# Patient Record
Sex: Female | Born: 1945
Health system: Southern US, Community
[De-identification: ages and names within clinical notes are randomized; demographics above are authoritative.]

## PROBLEM LIST (undated history)

## (undated) DIAGNOSIS — H269 Unspecified cataract: Secondary | ICD-10-CM

## (undated) DIAGNOSIS — E079 Disorder of thyroid, unspecified: Secondary | ICD-10-CM

## (undated) DIAGNOSIS — E785 Hyperlipidemia, unspecified: Secondary | ICD-10-CM

## (undated) DIAGNOSIS — T7840XA Allergy, unspecified, initial encounter: Secondary | ICD-10-CM

## (undated) DIAGNOSIS — I10 Essential (primary) hypertension: Secondary | ICD-10-CM

## (undated) DIAGNOSIS — D649 Anemia, unspecified: Secondary | ICD-10-CM

## (undated) DIAGNOSIS — G4730 Sleep apnea, unspecified: Secondary | ICD-10-CM

## (undated) DIAGNOSIS — R740 Nonspecific elevation of levels of transaminase and lactic acid dehydrogenase [LDH]: Secondary | ICD-10-CM

## (undated) HISTORY — DX: Essential (primary) hypertension: I10

## (undated) HISTORY — PX: EYE SURGERY: SHX253

## (undated) HISTORY — DX: Nonspecific elevation of levels of transaminase and lactic acid dehydrogenase (ldh): R74.0

## (undated) HISTORY — DX: Anemia, unspecified: D64.9

## (undated) HISTORY — DX: Disorder of thyroid, unspecified: E07.9

## (undated) HISTORY — PX: POLYPECTOMY: SHX149

## (undated) HISTORY — PX: ABDOMINAL HYSTERECTOMY: SHX81

## (undated) HISTORY — DX: Sleep apnea, unspecified: G47.30

## (undated) HISTORY — DX: Unspecified cataract: H26.9

## (undated) HISTORY — DX: Allergy, unspecified, initial encounter: T78.40XA

## (undated) HISTORY — DX: Hyperlipidemia, unspecified: E78.5

## (undated) HISTORY — PX: OTHER SURGICAL HISTORY: SHX169

## (undated) HISTORY — PX: SIGMOIDOSCOPY: SUR1295

---

## 1999-06-17 HISTORY — PX: FLEXIBLE SIGMOIDOSCOPY: SHX1649

## 2000-01-30 ENCOUNTER — Other Ambulatory Visit: Admission: RE | Admit: 2000-01-30 | Discharge: 2000-01-30 | Payer: Self-pay | Admitting: Internal Medicine

## 2003-07-06 ENCOUNTER — Encounter: Admission: RE | Admit: 2003-07-06 | Discharge: 2003-07-06 | Payer: Self-pay | Admitting: Internal Medicine

## 2003-10-18 ENCOUNTER — Ambulatory Visit (HOSPITAL_BASED_OUTPATIENT_CLINIC_OR_DEPARTMENT_OTHER): Admission: RE | Admit: 2003-10-18 | Discharge: 2003-10-18 | Payer: Self-pay | Admitting: Internal Medicine

## 2004-10-08 ENCOUNTER — Ambulatory Visit: Payer: Self-pay | Admitting: Internal Medicine

## 2004-10-22 ENCOUNTER — Ambulatory Visit: Payer: Self-pay | Admitting: Internal Medicine

## 2006-10-13 ENCOUNTER — Encounter: Admission: RE | Admit: 2006-10-13 | Discharge: 2006-10-13 | Payer: Self-pay | Admitting: Internal Medicine

## 2007-01-25 DIAGNOSIS — E785 Hyperlipidemia, unspecified: Secondary | ICD-10-CM | POA: Insufficient documentation

## 2008-07-04 ENCOUNTER — Ambulatory Visit: Payer: Self-pay | Admitting: Internal Medicine

## 2008-07-04 LAB — CONVERTED CEMR LAB
ALT: 69 units/L — ABNORMAL HIGH (ref 0–35)
AST: 53 units/L — ABNORMAL HIGH (ref 0–37)
Albumin: 4.3 g/dL (ref 3.5–5.2)
Alkaline Phosphatase: 62 units/L (ref 39–117)
BUN: 14 mg/dL (ref 6–23)
Basophils Absolute: 0 10*3/uL (ref 0.0–0.1)
Basophils Relative: 0.4 % (ref 0.0–3.0)
Bilirubin Urine: NEGATIVE
Bilirubin, Direct: 0.1 mg/dL (ref 0.0–0.3)
Blood in Urine, dipstick: NEGATIVE
CO2: 31 meq/L (ref 19–32)
Calcium: 9.6 mg/dL (ref 8.4–10.5)
Chloride: 104 meq/L (ref 96–112)
Cholesterol: 216 mg/dL (ref 0–200)
Creatinine, Ser: 0.9 mg/dL (ref 0.4–1.2)
Direct LDL: 148.4 mg/dL
Eosinophils Absolute: 0.1 10*3/uL (ref 0.0–0.7)
Eosinophils Relative: 1.9 % (ref 0.0–5.0)
GFR calc Af Amer: 82 mL/min
GFR calc non Af Amer: 67 mL/min
Glucose, Bld: 111 mg/dL — ABNORMAL HIGH (ref 70–99)
Glucose, Urine, Semiquant: NEGATIVE
HCT: 39.5 % (ref 36.0–46.0)
HDL: 40.7 mg/dL (ref >39.0)
Hemoglobin: 13.8 g/dL (ref 12.0–15.0)
Lymphocytes Relative: 33.2 % (ref 12.0–46.0)
MCHC: 35 g/dL (ref 30.0–36.0)
MCV: 91.9 fL (ref 78.0–100.0)
Monocytes Absolute: 0.6 10*3/uL (ref 0.1–1.0)
Monocytes Relative: 10.8 % (ref 3.0–12.0)
Neutro Abs: 3.2 10*3/uL (ref 1.4–7.7)
Neutrophils Relative %: 53.7 % (ref 43.0–77.0)
Nitrite: NEGATIVE
Platelets: 238 10*3/uL (ref 150–400)
Potassium: 4 meq/L (ref 3.5–5.1)
RBC: 4.3 M/uL (ref 3.87–5.11)
RDW: 12.9 % (ref 11.5–14.6)
Sodium: 143 meq/L (ref 135–145)
Specific Gravity, Urine: 1.02
TSH: 1.02 microintl units/mL (ref 0.35–5.50)
Total Bilirubin: 1 mg/dL (ref 0.3–1.2)
Total CHOL/HDL Ratio: 5.3
Total Protein: 7.9 g/dL (ref 6.0–8.3)
Triglycerides: 136 mg/dL (ref 0–149)
Urobilinogen, UA: 0.2
VLDL: 27 mg/dL (ref 0–40)
WBC: 5.9 10*3/uL (ref 4.5–10.5)
pH: 6

## 2008-07-11 ENCOUNTER — Ambulatory Visit: Payer: Self-pay | Admitting: Internal Medicine

## 2008-07-11 DIAGNOSIS — G4733 Obstructive sleep apnea (adult) (pediatric): Secondary | ICD-10-CM | POA: Insufficient documentation

## 2008-07-11 DIAGNOSIS — Z9989 Dependence on other enabling machines and devices: Secondary | ICD-10-CM

## 2008-10-11 ENCOUNTER — Ambulatory Visit: Payer: Self-pay | Admitting: Internal Medicine

## 2008-10-11 LAB — CONVERTED CEMR LAB
ALT: 90 units/L — ABNORMAL HIGH (ref 0–35)
AST: 70 units/L — ABNORMAL HIGH (ref 0–37)
Albumin: 4 g/dL (ref 3.5–5.2)
Alkaline Phosphatase: 66 units/L (ref 39–117)
Bilirubin, Direct: 0 mg/dL (ref 0.0–0.3)
Cholesterol: 225 mg/dL — ABNORMAL HIGH (ref 0–200)
Direct LDL: 148.8 mg/dL
HDL: 37.9 mg/dL — ABNORMAL LOW (ref >39.00)
Hgb A1c MFr Bld: 6.1 % (ref 4.6–6.5)
Total Bilirubin: 0.9 mg/dL (ref 0.3–1.2)
Total CHOL/HDL Ratio: 6
Total Protein: 7.8 g/dL (ref 6.0–8.3)
Triglycerides: 158 mg/dL — ABNORMAL HIGH (ref 0.0–149.0)
VLDL: 31.6 mg/dL (ref 0.0–40.0)

## 2008-10-19 ENCOUNTER — Ambulatory Visit: Payer: Self-pay | Admitting: Internal Medicine

## 2008-10-19 DIAGNOSIS — R7401 Elevation of levels of liver transaminase levels: Secondary | ICD-10-CM

## 2008-10-19 DIAGNOSIS — I10 Essential (primary) hypertension: Secondary | ICD-10-CM | POA: Insufficient documentation

## 2008-10-19 DIAGNOSIS — R740 Nonspecific elevation of levels of transaminase and lactic acid dehydrogenase [LDH]: Secondary | ICD-10-CM

## 2008-10-19 DIAGNOSIS — R7402 Elevation of levels of lactic acid dehydrogenase (LDH): Secondary | ICD-10-CM

## 2008-10-19 HISTORY — DX: Elevation of levels of liver transaminase levels: R74.01

## 2008-10-19 HISTORY — DX: Elevation of levels of lactic acid dehydrogenase (LDH): R74.02

## 2008-10-19 LAB — CONVERTED CEMR LAB
Ceruloplasmin: 41 mg/dL (ref 21–63)
HCV Ab: NEGATIVE
Hep A IgM: NEGATIVE
Hep B C IgM: NEGATIVE
Hepatitis B Surface Ag: NEGATIVE
Iron: 74 ug/dL (ref 42–145)
LDL Goal: 130 mg/dL
Saturation Ratios: 19.3 % — ABNORMAL LOW (ref 20.0–50.0)
Transferrin: 274 mg/dL (ref 212.0–360.0)

## 2008-10-23 ENCOUNTER — Encounter: Admission: RE | Admit: 2008-10-23 | Discharge: 2008-10-23 | Payer: Self-pay | Admitting: Internal Medicine

## 2009-01-18 ENCOUNTER — Ambulatory Visit: Payer: Self-pay | Admitting: Internal Medicine

## 2009-01-22 LAB — CONVERTED CEMR LAB
ALT: 64 units/L — ABNORMAL HIGH (ref 0–35)
AST: 52 units/L — ABNORMAL HIGH (ref 0–37)
Albumin: 3.9 g/dL (ref 3.5–5.2)
Alkaline Phosphatase: 59 units/L (ref 39–117)
Bilirubin, Direct: 0.1 mg/dL (ref 0.0–0.3)
Total Bilirubin: 0.9 mg/dL (ref 0.3–1.2)
Total Protein: 7.4 g/dL (ref 6.0–8.3)

## 2009-02-02 ENCOUNTER — Ambulatory Visit: Payer: Self-pay | Admitting: Internal Medicine

## 2009-05-24 ENCOUNTER — Ambulatory Visit: Payer: Self-pay | Admitting: Internal Medicine

## 2009-05-24 LAB — CONVERTED CEMR LAB
ALT: 72 units/L — ABNORMAL HIGH (ref 0–35)
AST: 60 units/L — ABNORMAL HIGH (ref 0–37)
Albumin: 3.9 g/dL (ref 3.5–5.2)
Alkaline Phosphatase: 58 units/L (ref 39–117)
Bilirubin, Direct: 0 mg/dL (ref 0.0–0.3)
Total Bilirubin: 0.8 mg/dL (ref 0.3–1.2)
Total Protein: 7.5 g/dL (ref 6.0–8.3)

## 2009-09-13 ENCOUNTER — Ambulatory Visit: Payer: Self-pay | Admitting: Internal Medicine

## 2009-09-13 LAB — CONVERTED CEMR LAB
ALT: 81 units/L — ABNORMAL HIGH (ref 0–35)
AST: 76 units/L — ABNORMAL HIGH (ref 0–37)
Albumin: 4 g/dL (ref 3.5–5.2)
Alkaline Phosphatase: 57 units/L (ref 39–117)
Bilirubin, Direct: 0.1 mg/dL (ref 0.0–0.3)
Cholesterol: 225 mg/dL — ABNORMAL HIGH (ref 0–200)
Direct LDL: 156.4 mg/dL
HDL: 39.9 mg/dL (ref >39.00)
Total Bilirubin: 0.6 mg/dL (ref 0.3–1.2)
Total Protein: 7.5 g/dL (ref 6.0–8.3)
Triglycerides: 240 mg/dL — ABNORMAL HIGH (ref 0.0–149.0)

## 2010-01-01 ENCOUNTER — Ambulatory Visit: Payer: Self-pay | Admitting: Family Medicine

## 2010-01-01 ENCOUNTER — Telehealth: Payer: Self-pay | Admitting: Internal Medicine

## 2010-01-01 DIAGNOSIS — S139XXA Sprain of joints and ligaments of unspecified parts of neck, initial encounter: Secondary | ICD-10-CM | POA: Insufficient documentation

## 2010-01-04 ENCOUNTER — Ambulatory Visit: Payer: Self-pay | Admitting: Internal Medicine

## 2010-01-04 LAB — CONVERTED CEMR LAB
ALT: 68 units/L — ABNORMAL HIGH (ref 0–35)
AST: 53 units/L — ABNORMAL HIGH (ref 0–37)
Albumin: 4.2 g/dL (ref 3.5–5.2)
Alkaline Phosphatase: 52 units/L (ref 39–117)
BUN: 12 mg/dL (ref 6–23)
Bilirubin, Direct: 0.2 mg/dL (ref 0.0–0.3)
CO2: 27 meq/L (ref 19–32)
Calcium: 9.7 mg/dL (ref 8.4–10.5)
Chloride: 104 meq/L (ref 96–112)
Cholesterol: 234 mg/dL — ABNORMAL HIGH (ref 0–200)
Creatinine, Ser: 0.8 mg/dL (ref 0.4–1.2)
Direct LDL: 159.1 mg/dL
GFR calc non Af Amer: 76.78 mL/min (ref >60)
Glucose, Bld: 110 mg/dL — ABNORMAL HIGH (ref 70–99)
HDL: 38.5 mg/dL — ABNORMAL LOW (ref >39.00)
Potassium: 4.5 meq/L (ref 3.5–5.1)
Sodium: 142 meq/L (ref 135–145)
Total Bilirubin: 0.7 mg/dL (ref 0.3–1.2)
Total CHOL/HDL Ratio: 6
Total Protein: 7.6 g/dL (ref 6.0–8.3)
Triglycerides: 178 mg/dL — ABNORMAL HIGH (ref 0.0–149.0)
VLDL: 35.6 mg/dL (ref 0.0–40.0)

## 2010-02-20 ENCOUNTER — Telehealth: Payer: Self-pay | Admitting: Internal Medicine

## 2010-04-19 ENCOUNTER — Ambulatory Visit: Payer: Self-pay | Admitting: Internal Medicine

## 2010-04-19 DIAGNOSIS — E669 Obesity, unspecified: Secondary | ICD-10-CM | POA: Insufficient documentation

## 2010-05-11 LAB — HM SIGMOIDOSCOPY

## 2010-05-17 ENCOUNTER — Encounter: Admission: RE | Admit: 2010-05-17 | Discharge: 2010-05-17 | Payer: Self-pay | Admitting: Internal Medicine

## 2010-05-17 LAB — HM MAMMOGRAPHY

## 2010-06-28 ENCOUNTER — Ambulatory Visit: Admit: 2010-06-28 | Payer: Self-pay | Admitting: Internal Medicine

## 2010-07-09 ENCOUNTER — Ambulatory Visit: Admit: 2010-07-09 | Payer: Self-pay | Admitting: Internal Medicine

## 2010-07-18 NOTE — Assessment & Plan Note (Signed)
Summary: blood pressure check/cjr   Vital Signs:  Patient profile:   65 year old female Height:      67 inches Weight:      230 pounds BMI:     36.15 Temp:     98.2 degrees F oral Pulse rate:   80 / minute Resp:     14 per minute BP sitting:   150 / 100  (left arm)  Vitals Entered By: Willy Eddy, LPN (September 13, 2009 11:02 AM) CC: roa had labs in january=has only been taking 1/2 of benicar because she was running out, Hypertension Management   CC:  roa had labs in january=has only been taking 1/2 of benicar because she was running out and Hypertension Management.  History of Present Illness: The pt is over due checking the liver function blood pressure is elevated due to stress, weight and apnea 9 wears machine)   Hypertension History:      She denies headache, chest pain, palpitations, dyspnea with exertion, orthopnea, PND, peripheral edema, visual symptoms, neurologic problems, syncope, and side effects from treatment.        Positive major cardiovascular risk factors include female age 81 years old or older, hyperlipidemia, and hypertension.  Negative major cardiovascular risk factors include no history of diabetes and non-tobacco-user status.        Further assessment for target organ damage reveals no history of ASHD, stroke/TIA, or peripheral vascular disease.     Preventive Screening-Counseling & Management  Alcohol-Tobacco     Smoking Status: never  Current Problems (verified): 1)  Hypertension  (ICD-401.9) 2)  Essential Hypertension  (ICD-401.9) 3)  Transaminases, Serum, Elevated  (ICD-790.4) 4)  Sleep Apnea  (ICD-780.57) 5)  Physical Examination  (ICD-V70.0) 6)  Hyperlipidemia  (ICD-272.4)  Current Medications (verified): 1)  Multivitamins   Caps (Multiple Vitamin) .... Once Daily 2)  Calcium 600 1500 Mg Tabs (Calcium Carbonate) .Marland Kitchen.. 1 Once Daily 3)  Fish Oil 1000 Mg Caps (Omega-3 Fatty Acids) .... Two Times A Day 4)  Benicar 20 Mg Tabs (Olmesartan  Medoxomil) .... One By Mouth Daily  Allergies: 1)  ! Codeine Sulfate (Codeine Sulfate) 2)  ! Diphtheria-Tetanus Toxoids (Diphtheria-Tetanus Toxoids Dt)  Past History:  Family History: Last updated: 07/11/2008 Family History High cholesterol Family History Hypertension Family History of Stroke M 1st degree relative  father 67, mother 35 hx of spinal muscle atrophy  Social History: Last updated: 07/11/2008 Married Never Smoked Alcohol use-yes Drug use-no Regular exercise-no  Risk Factors: Exercise: no (07/11/2008)  Risk Factors: Smoking Status: never (09/13/2009)  Past medical, surgical, family and social histories (including risk factors) reviewed, and no changes noted (except as noted below).  Past Medical History: Reviewed history from 10/19/2008 and no changes required. Hyperlipidemia  off lipitor sleep apnea Hypertension  Past Surgical History: Reviewed history from 07/11/2008 and no changes required. Hysterectomy flex 2001  Family History: Reviewed history from 07/11/2008 and no changes required. Family History High cholesterol Family History Hypertension Family History of Stroke M 1st degree relative  father 62, mother 77 hx of spinal muscle atrophy  Social History: Reviewed history from 07/11/2008 and no changes required. Married Never Smoked Alcohol use-yes Drug use-no Regular exercise-no  Review of Systems       The patient complains of weight gain.  The patient denies anorexia, fever, weight loss, vision loss, decreased hearing, hoarseness, chest pain, syncope, dyspnea on exertion, peripheral edema, prolonged cough, headaches, hemoptysis, abdominal pain, melena, hematochezia, severe indigestion/heartburn, hematuria, incontinence, genital sores,  muscle weakness, suspicious skin lesions, transient blindness, difficulty walking, depression, unusual weight change, abnormal bleeding, enlarged lymph nodes, angioedema, and breast masses.    Physical  Exam  General:  alert and overweight-appearing.   Head:  normocephalic and atraumatic.   Eyes:  pupils equal and pupils round.   Ears:  R ear normal and L ear normal.   Nose:  no external deformity.   Mouth:  good dentition and pharynx pink and moist.   Neck:  No deformities, masses, or tenderness noted. Lungs:  normal respiratory effort, no accessory muscle use, and no wheezes.   Heart:  normal rate and regular rhythm.   Abdomen:  soft and non-tender.   Extremities:  No clubbing, cyanosis, edema, or deformity noted with normal full range of motion of all joints.   Neurologic:  No cranial nerve deficits noted. Station and gait are normal. Plantar reflexes are down-going bilaterally. DTRs are symmetrical throughout. Sensory, motor and coordinative functions appear intact.   Impression & Recommendations:  Problem # 1:  HYPERTENSION (ICD-401.9) Assessment Deteriorated  Her updated medication list for this problem includes:    Benicar 20 Mg Tabs (Olmesartan medoxomil) ..... One by mouth daily    Benicar Hct 20-12.5 Mg Tabs (Olmesartan medoxomil-hctz) ..... One by mouth daily  BP today: 150/100 Prior BP: 130/76 (02/02/2009)  Prior 10 Yr Risk Heart Disease: Not enough information (10/19/2008)  Labs Reviewed: K+: 4.0 (07/04/2008) Creat: : 0.9 (07/04/2008)   Chol: 225 (10/11/2008)   HDL: 37.90 (10/11/2008)   LDL: DEL (07/04/2008)   TG: 158.0 (10/11/2008)  Problem # 2:  TRANSAMINASES, SERUM, ELEVATED (ICD-790.4) Assessment: Unchanged stable Orders: TLB-Hepatic/Liver Function Pnl (80076-HEPATIC) TLB-Triglycerides (84478-TRG)  Problem # 3:  HYPERLIPIDEMIA (ICD-272.4) Assessment: Unchanged  Orders: Venipuncture (40981) TLB-Cholesterol, HDL (83718-HDL) TLB-Cholesterol, Direct LDL (83721-DIRLDL) TLB-Cholesterol, Total (82465-CHO) TLB-Triglycerides (84478-TRG)  Labs Reviewed: SGOT: 60 (05/24/2009)   SGPT: 72 (05/24/2009)  Lipid Goals: Chol Goal: 200 (07/11/2008)   HDL Goal: 40  (07/11/2008)   LDL Goal: 130 (10/19/2008)   TG Goal: 150 (07/11/2008)  Prior 10 Yr Risk Heart Disease: Not enough information (10/19/2008)   HDL:37.90 (10/11/2008), 40.7 (07/04/2008)  LDL:DEL (07/04/2008)  Chol:225 (10/11/2008), 216 (07/04/2008)  Trig:158.0 (10/11/2008), 136 (07/04/2008)  Complete Medication List: 1)  Multivitamins Caps (Multiple vitamin) .... Once daily 2)  Calcium 600 1500 Mg Tabs (Calcium carbonate) .Marland Kitchen.. 1 once daily 3)  Fish Oil 1000 Mg Caps (Omega-3 fatty acids) .... Two times a day 4)  Benicar 20 Mg Tabs (Olmesartan medoxomil) .... One by mouth daily 5)  Benicar Hct 20-12.5 Mg Tabs (Olmesartan medoxomil-hctz) .... One by mouth daily  Hypertension Assessment/Plan:      The patient's hypertensive risk group is category B: At least one risk factor (excluding diabetes) with no target organ damage.  Today's blood pressure is 150/100.  Her blood pressure goal is < 140/90.  Patient Instructions: 1)  Please schedule a follow-up appointment in 3 months.
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
## 2010-07-18 NOTE — Progress Notes (Signed)
Summary: bloodwork results LMTCB 9-7  Phone Note Call from Patient Call back at Work Phone [phone]   Caller: Patient Call For: Stacie Glaze MD Summary of Call: pt needs labwork results  LMTCB or we'll call back.  Rudy Jew, RN  February 20, 2010 3:59 PM Tried number again.  Still on vm.  Rudy Jew, RN  February 20, 2010 4:38 PM   Initial call taken by: Heron Sabins,  February 20, 2010 3:40 PM  Follow-up for Phone Call        Pt given verbal lab readings form 12/2009. Follow-up by: Lynann Beaver CMA,  February 21, 2010 11:22 AM

## 2010-07-18 NOTE — Progress Notes (Signed)
Summary: AA pain  Phone Note Call from Patient Call back at Work Phone [phone] Call back at [phone] c   Caller: vm Summary of Call: AA Rear ended last pm.  Shoulders & back lot of pain.  See him earler than Fri.  Or what does he suggest?  Initial call taken by: Rudy Jew, RN,  January 01, 2010 1:59 PM  Follow-up for Phone Call        ov with dr blyth-pt informed Follow-up by: Willy Eddy, LPN,  January 01, 2010 2:36 PM

## 2010-07-18 NOTE — Assessment & Plan Note (Signed)
Summary: 3 month fup//ccm rsc bmp/njr   Vital Signs:  Patient profile:   65 year old female Height:      67 inches Weight:      232 pounds BMI:     36.47 Temp:     98.2 degrees F oral Pulse rate:   72 / minute Resp:     14 per minute BP sitting:   122 / 78  (left arm)  Vitals Entered By: Willy Eddy, LPN (April 19, 2010 9:30 AM)  Nutrition Counseling: Patient's BMI is greater than 25 and therefore counseled on weight management options. CC: roa, Hypertension Management Is Patient Diabetic? No   Primary Care Provider:  Stacie Glaze MD  CC:  roa and Hypertension Management.  History of Present Illness: obesity and fatty liver HTN stable risk of DM high non compliance with diet problematic  Hypertension History:      She denies headache, chest pain, palpitations, dyspnea with exertion, orthopnea, PND, peripheral edema, visual symptoms, neurologic problems, syncope, and side effects from treatment.        Positive major cardiovascular risk factors include female age 49 years old or older, hyperlipidemia, and hypertension.  Negative major cardiovascular risk factors include no history of diabetes and non-tobacco-user status.        Further assessment for target organ damage reveals no history of ASHD, stroke/TIA, or peripheral vascular disease.     Preventive Screening-Counseling & Management  Alcohol-Tobacco     Smoking Status: never  Current Problems (verified): 1)  Cervical Strain, Acute  (ICD-847.0) 2)  Hypertension  (ICD-401.9) 3)  Essential Hypertension  (ICD-401.9) 4)  Transaminases, Serum, Elevated  (ICD-790.4) 5)  Sleep Apnea  (ICD-780.57) 6)  Physical Examination  (ICD-V70.0) 7)  Hyperlipidemia  (ICD-272.4)  Current Medications (verified): 1)  Multivitamins   Caps (Multiple Vitamin) .... Once Daily 2)  Calcium 600 1500 Mg Tabs (Calcium Carbonate) .Marland Kitchen.. 1 Once Daily 3)  Fish Oil 1000 Mg Caps (Omega-3 Fatty Acids) .... Two Times A Day 4)  Benicar  Hct 20-12.5 Mg Tabs (Olmesartan Medoxomil-Hctz) .... One By Mouth Daily  Allergies (verified): 1)  ! Codeine Sulfate (Codeine Sulfate) 2)  ! Diphtheria-Tetanus Toxoids (Diphtheria-Tetanus Toxoids Dt)  Past History:  Family History: Last updated: 07/11/2008 Family History High cholesterol Family History Hypertension Family History of Stroke M 1st degree relative  father 94, mother 75 hx of spinal muscle atrophy  Social History: Last updated: 07/11/2008 Married Never Smoked Alcohol use-yes Drug use-no Regular exercise-no  Risk Factors: Exercise: no (07/11/2008)  Risk Factors: Smoking Status: never (04/19/2010)  Past medical, surgical, family and social histories (including risk factors) reviewed, and no changes noted (except as noted below).  Past Medical History: Reviewed history from 10/19/2008 and no changes required. Hyperlipidemia  off lipitor sleep apnea Hypertension  Past Surgical History: Reviewed history from 07/11/2008 and no changes required. Hysterectomy flex 2001  Family History: Reviewed history from 07/11/2008 and no changes required. Family History High cholesterol Family History Hypertension Family History of Stroke M 1st degree relative  father 49, mother 32 hx of spinal muscle atrophy  Social History: Reviewed history from 07/11/2008 and no changes required. Married Never Smoked Alcohol use-yes Drug use-no Regular exercise-no  Review of Systems  The patient denies anorexia, fever, weight loss, weight gain, vision loss, decreased hearing, hoarseness, chest pain, syncope, dyspnea on exertion, peripheral edema, prolonged cough, headaches, hemoptysis, abdominal pain, melena, hematochezia, severe indigestion/heartburn, hematuria, incontinence, genital sores, muscle weakness, suspicious skin lesions, transient blindness,  difficulty walking, depression, unusual weight change, abnormal bleeding, enlarged lymph nodes, angioedema, and breast masses.      Physical Exam  General:  Well-developed,well-nourished,in no acute distress; alert,appropriate and cooperative throughout examination Head:  Normocephalic and atraumatic without obvious abnormalities. No apparent alopecia or balding. Eyes:  No corneal or conjunctival inflammation noted. EOMI. Perrla.  Ears:  R ear normal and L ear normal.   Nose:  External nasal examination shows no deformity or inflammation. Nasal mucosa are pink and moist without lesions or exudates. Mouth:  good dentition and pharynx pink and moist.   Neck:  No deformities, masses Lungs:  Normal respiratory effort, chest expands symmetrically. Lungs are clear to auscultation, no crackles or wheezes. Heart:  Normal rate and regular rhythm. S1 and S2 normal without gallop, murmur, click, rub or other extra sounds.   Impression & Recommendations:  Problem # 1:  ESSENTIAL HYPERTENSION (ICD-401.9)  Her updated medication list for this problem includes:    Benicar Hct 20-12.5 Mg Tabs (Olmesartan medoxomil-hctz) ..... One by mouth daily  BP today: 122/78 Prior BP: 126/84 (01/04/2010)  Prior 10 Yr Risk Heart Disease: Not enough information (10/19/2008)  Labs Reviewed: K+: 4.5 (01/04/2010) Creat: : 0.8 (01/04/2010)   Chol: 234 (01/04/2010)   HDL: 38.50 (01/04/2010)   LDL: DEL (07/04/2008)   TG: 178.0 (01/04/2010)  Problem # 2:  TRANSAMINASES, SERUM, ELEVATED (ICD-790.4) persistnat elevation with probable fatty liver  Problem # 3:  HYPERLIPIDEMIA (ICD-272.4)  Labs Reviewed: SGOT: 53 (01/04/2010)   SGPT: 68 (01/04/2010)  Lipid Goals: Chol Goal: 200 (07/11/2008)   HDL Goal: 40 (07/11/2008)   LDL Goal: 130 (10/19/2008)   TG Goal: 150 (07/11/2008)  Prior 10 Yr Risk Heart Disease: Not enough information (10/19/2008)   HDL:38.50 (01/04/2010), 39.90 (09/13/2009)  LDL:DEL (07/04/2008)  Chol:234 (01/04/2010), 225 (09/13/2009)  Trig:178.0 (01/04/2010), 240.0 (09/13/2009)  Her updated medication list for this problem  includes:    Livalo 4 Mg Tabs (Pitavastatin calcium) ..... One by mouth  twice a week  Problem # 4:  MORBID OBESITY (ICD-278.01)  Ht: 67 (04/19/2010)   Wt: 232 (04/19/2010)   BMI: 36.47 (04/19/2010)  Complete Medication List: 1)  Multivitamins Caps (Multiple vitamin) .... Once daily 2)  Calcium 600 1500 Mg Tabs (Calcium carbonate) .Marland Kitchen.. 1 once daily 3)  Fish Oil 1000 Mg Caps (Omega-3 fatty acids) .... Two times a day 4)  Benicar Hct 20-12.5 Mg Tabs (Olmesartan medoxomil-hctz) .... One by mouth daily 5)  Livalo 4 Mg Tabs (Pitavastatin calcium) .... One by mouth  twice a week  Hypertension Assessment/Plan:      The patient's hypertensive risk group is category B: At least one risk factor (excluding diabetes) with no target organ damage.  Today's blood pressure is 122/78.  Her blood pressure goal is < 140/90.  Patient Instructions: 1)  Please schedule a follow-up appointment in 3 months. 2)  It is important that you exercise regularly at least 20 minutes 5 times a week. If you develop chest pain, have severe difficulty breathing, or feel very tired , stop exercising immediately and seek medical attention. 3)  DASH diet is best! for BOTH of YOU!!!!!!! 4)  BMP prior to visit, ICD-9:401.9 5)  Hepatic Panel prior to visit, ICD-9: 995.20 6)  Lipid Panel prior to visit, ICD-9:272.4   Orders Added: 1)  Est. Patient Level IV [16109]
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
## 2010-08-16 ENCOUNTER — Other Ambulatory Visit (INDEPENDENT_AMBULATORY_CARE_PROVIDER_SITE_OTHER): Payer: 59 | Admitting: Internal Medicine

## 2010-08-16 DIAGNOSIS — E785 Hyperlipidemia, unspecified: Secondary | ICD-10-CM

## 2010-08-16 DIAGNOSIS — I10 Essential (primary) hypertension: Secondary | ICD-10-CM

## 2010-08-16 DIAGNOSIS — T887XXA Unspecified adverse effect of drug or medicament, initial encounter: Secondary | ICD-10-CM

## 2010-08-16 LAB — LIPID PANEL
Cholesterol: 170 mg/dL (ref 0–200)
HDL: 39.4 mg/dL (ref >39.00)
LDL Cholesterol: 101 mg/dL — ABNORMAL HIGH (ref 0–99)
Triglycerides: 149 mg/dL (ref 0.0–149.0)

## 2010-08-16 LAB — BASIC METABOLIC PANEL
BUN: 16 mg/dL (ref 6–23)
CO2: 28 mEq/L (ref 19–32)
Chloride: 105 mEq/L (ref 96–112)
Creatinine, Ser: 0.9 mg/dL (ref 0.4–1.2)
GFR: 65.21 mL/min (ref >60.00)
Glucose, Bld: 90 mg/dL (ref 70–99)
Potassium: 4.4 mEq/L (ref 3.5–5.1)
Sodium: 140 mEq/L (ref 135–145)

## 2010-08-16 LAB — HEPATIC FUNCTION PANEL
ALT: 70 U/L — ABNORMAL HIGH (ref 0–35)
AST: 62 U/L — ABNORMAL HIGH (ref 0–37)
Albumin: 4.3 g/dL (ref 3.5–5.2)
Alkaline Phosphatase: 48 U/L (ref 39–117)
Bilirubin, Direct: 0.1 mg/dL (ref 0.0–0.3)
Total Bilirubin: 0.6 mg/dL (ref 0.3–1.2)
Total Protein: 7.4 g/dL (ref 6.0–8.3)

## 2010-08-26 ENCOUNTER — Ambulatory Visit (INDEPENDENT_AMBULATORY_CARE_PROVIDER_SITE_OTHER): Payer: 59 | Admitting: Internal Medicine

## 2010-08-26 ENCOUNTER — Encounter: Payer: Self-pay | Admitting: Internal Medicine

## 2010-08-26 VITALS — BP 110/70 | HR 72 | Temp 98.2°F | Resp 14 | Ht 66.0 in | Wt 234.0 lb

## 2010-08-26 DIAGNOSIS — E669 Obesity, unspecified: Secondary | ICD-10-CM

## 2010-11-28 ENCOUNTER — Ambulatory Visit: Payer: 59 | Admitting: Internal Medicine

## 2010-12-24 ENCOUNTER — Telehealth: Payer: Self-pay | Admitting: Internal Medicine

## 2010-12-24 MED ORDER — OLMESARTAN MEDOXOMIL-HCTZ 20-12.5 MG PO TABS: 1.0000 | ORAL_TABLET | Freq: Every day | ORAL | Status: DC

## 2010-12-24 NOTE — Telephone Encounter (Signed)
Need new rx for Benicar. Was on samples. [facility]

## 2010-12-27 ENCOUNTER — Ambulatory Visit: Payer: 59 | Admitting: Internal Medicine

## 2011-06-10 ENCOUNTER — Ambulatory Visit (INDEPENDENT_AMBULATORY_CARE_PROVIDER_SITE_OTHER): Payer: 59

## 2011-06-10 DIAGNOSIS — R059 Cough, unspecified: Secondary | ICD-10-CM

## 2011-06-10 DIAGNOSIS — J029 Acute pharyngitis, unspecified: Secondary | ICD-10-CM

## 2011-06-10 DIAGNOSIS — R05 Cough: Secondary | ICD-10-CM

## 2011-06-10 DIAGNOSIS — J111 Influenza due to unidentified influenza virus with other respiratory manifestations: Secondary | ICD-10-CM

## 2011-09-16 ENCOUNTER — Other Ambulatory Visit: Payer: Self-pay | Admitting: Internal Medicine

## 2011-09-16 DIAGNOSIS — Z1231 Encounter for screening mammogram for malignant neoplasm of breast: Secondary | ICD-10-CM

## 2011-09-18 ENCOUNTER — Ambulatory Visit (INDEPENDENT_AMBULATORY_CARE_PROVIDER_SITE_OTHER): Payer: 59 | Admitting: Family

## 2011-09-18 ENCOUNTER — Encounter: Payer: Self-pay | Admitting: Family

## 2011-09-18 VITALS — BP 110/80 | Temp 98.8°F | Wt 239.0 lb

## 2011-09-18 DIAGNOSIS — J0100 Acute maxillary sinusitis, unspecified: Secondary | ICD-10-CM

## 2011-09-18 DIAGNOSIS — R05 Cough: Secondary | ICD-10-CM

## 2011-09-18 DIAGNOSIS — J209 Acute bronchitis, unspecified: Secondary | ICD-10-CM

## 2011-09-18 DIAGNOSIS — R059 Cough, unspecified: Secondary | ICD-10-CM

## 2011-09-18 MED ORDER — AZITHROMYCIN 250 MG PO TABS: 250.0000 mg | ORAL_TABLET | Freq: Every day | ORAL | Status: AC

## 2011-09-18 MED ORDER — METHYLPREDNISOLONE 4 MG PO KIT: PACK | ORAL | Status: AC

## 2011-09-18 NOTE — Patient Instructions (Signed)

## 2011-09-18 NOTE — Progress Notes (Signed)
  Subjective:    Patient ID: Erica Burke, female    DOB: Aug 06, 1945, 66 y.o.   MRN: [id number]  HPI 66 year old WF in with c/o cough, congestion, sinus pressure, wheezing x 6 days. She has been taking Ibuprofen with minimal relief. Granddaughter is also ill with a bacterial infection.    Review of Systems  Constitutional: Positive for fatigue.  HENT: Positive for congestion, rhinorrhea, sneezing and postnasal drip.   Respiratory: Positive for cough and wheezing.   Cardiovascular: Negative.   Gastrointestinal: Negative.   Musculoskeletal: Negative.   Skin: Negative.   Neurological: Negative.   Hematological: Negative.   Psychiatric/Behavioral: Negative.    Past Medical History  Diagnosis Date  . Hyperlipidemia   . Sleep apnea   . Hypertension     History   Social History  . Marital Status: Single    Spouse Name: N/A    Number of Children: N/A  . Years of Education: N/A   Occupational History  . Not on file.   Social History Main Topics  . Smoking status: Never Smoker   . Smokeless tobacco: Not on file  . Alcohol Use: No  . Drug Use: No  . Sexually Active: Not on file   Other Topics Concern  . Not on file   Social History Narrative  . No narrative on file    Past Surgical History  Procedure Date  . Abdominal hysterectomy   . Flexible bronchoscopy 2001    Family History  Problem Relation Age of Onset  . Stroke Mother   . Stroke Father   . Hypertension Other     Allergies  Allergen Reactions  . Codeine Sulfate     REACTION: unspecified  . Tenivac     REACTION: unspecified    Current Outpatient Prescriptions on File Prior to Visit  Medication Sig Dispense Refill  . calcium carbonate 1250 MG capsule Take 1,250 mg by mouth daily.        . Multiple Vitamin (MULTIVITAMIN) capsule Take 1 capsule by mouth daily.        Marland Kitchen olmesartan-hydrochlorothiazide (BENICAR HCT) 20-12.5 MG per tablet Take 1 tablet by mouth daily.  30 tablet  6  . Omega-3 Fatty Acids  (FISH OIL) 1000 MG CAPS Take by mouth.        . Pitavastatin Calcium (LIVALO) 4 MG TABS Take by mouth. One by mouth twice a week         BP 110/80  Temp(Src) 98.8 F (37.1 C) (Oral)  Wt 239 lb (108.41 kg)chart     Objective:   Physical Exam  Constitutional: She is oriented to person, place, and time. She appears well-developed and well-nourished.  HENT:  Right Ear: External ear normal.  Left Ear: External ear normal.  Nose: Nose normal.  Mouth/Throat: Oropharynx is clear and moist.  Neck: Normal range of motion. Neck supple.  Cardiovascular: Normal rate, regular rhythm and normal heart sounds.   Pulmonary/Chest: Effort normal and breath sounds normal.  Musculoskeletal: Normal range of motion.  Neurological: She is alert and oriented to person, place, and time.  Skin: Skin is warm and dry.  Psychiatric: She has a normal mood and affect.          Assessment & Plan:  Assessment: Sinusitis, Bronchitis, Cough  Plan: Delsym, Zpak and Medrol DP as directed. Rest. Drink plenty of fluids. Call the office if symptoms worsen or persist.

## 2011-09-23 ENCOUNTER — Other Ambulatory Visit: Payer: 59

## 2011-10-02 ENCOUNTER — Ambulatory Visit
Admission: RE | Admit: 2011-10-02 | Discharge: 2011-10-02 | Disposition: A | Discharge status: Home / Self Care | Payer: 59 | Source: Ambulatory Visit | Attending: Internal Medicine | Admitting: Internal Medicine

## 2011-10-02 DIAGNOSIS — Z1231 Encounter for screening mammogram for malignant neoplasm of breast: Secondary | ICD-10-CM

## 2011-10-03 ENCOUNTER — Encounter: Payer: 59 | Admitting: Internal Medicine

## 2011-10-14 ENCOUNTER — Other Ambulatory Visit: Payer: Self-pay | Admitting: Internal Medicine

## 2011-12-10 ENCOUNTER — Other Ambulatory Visit (INDEPENDENT_AMBULATORY_CARE_PROVIDER_SITE_OTHER): Payer: 59

## 2011-12-10 DIAGNOSIS — Z0000 Encounter for general adult medical examination without abnormal findings: Secondary | ICD-10-CM

## 2011-12-10 LAB — POCT URINALYSIS DIPSTICK
Bilirubin, UA: NEGATIVE
Glucose, UA: NEGATIVE
Ketones, UA: NEGATIVE
Leukocytes, UA: NEGATIVE
Nitrite, UA: NEGATIVE
Protein, UA: NEGATIVE
Spec Grav, UA: 1.02
Urobilinogen, UA: 0.2

## 2011-12-10 LAB — BASIC METABOLIC PANEL
BUN: 15 mg/dL (ref 6–23)
CO2: 28 mEq/L (ref 19–32)
Calcium: 10 mg/dL (ref 8.4–10.5)
Chloride: 104 mEq/L (ref 96–112)
Creatinine, Ser: 0.8 mg/dL (ref 0.4–1.2)
Glucose, Bld: 98 mg/dL (ref 70–99)
Potassium: 4 mEq/L (ref 3.5–5.1)
Sodium: 140 mEq/L (ref 135–145)

## 2011-12-10 LAB — CBC WITH DIFFERENTIAL/PLATELET
Basophils Absolute: 0 10*3/uL (ref 0.0–0.1)
Basophils Relative: 0.7 % (ref 0.0–3.0)
Eosinophils Absolute: 0.1 10*3/uL (ref 0.0–0.7)
HCT: 39.3 % (ref 36.0–46.0)
Hemoglobin: 13.1 g/dL (ref 12.0–15.0)
Lymphocytes Relative: 34.4 % (ref 12.0–46.0)
Lymphs Abs: 2 10*3/uL (ref 0.7–4.0)
MCHC: 33.3 g/dL (ref 30.0–36.0)
MCV: 94.3 fl (ref 78.0–100.0)
Monocytes Absolute: 0.6 10*3/uL (ref 0.1–1.0)
Monocytes Relative: 10.4 % (ref 3.0–12.0)
Neutro Abs: 3.1 10*3/uL (ref 1.4–7.7)
RBC: 4.17 Mil/uL (ref 3.87–5.11)
RDW: 13.7 % (ref 11.5–14.6)
WBC: 5.8 10*3/uL (ref 4.5–10.5)

## 2011-12-10 LAB — LIPID PANEL
Cholesterol: 191 mg/dL (ref 0–200)
Total CHOL/HDL Ratio: 5
Triglycerides: 127 mg/dL (ref 0.0–149.0)
VLDL: 25.4 mg/dL (ref 0.0–40.0)

## 2011-12-10 LAB — HEPATIC FUNCTION PANEL
ALT: 38 U/L — ABNORMAL HIGH (ref 0–35)
AST: 37 U/L (ref 0–37)
Albumin: 4.5 g/dL (ref 3.5–5.2)
Alkaline Phosphatase: 45 U/L (ref 39–117)
Total Protein: 7.9 g/dL (ref 6.0–8.3)

## 2011-12-10 LAB — TSH: TSH: 1.29 u[IU]/mL (ref 0.35–5.50)

## 2011-12-17 ENCOUNTER — Encounter: Payer: Self-pay | Admitting: Internal Medicine

## 2011-12-17 ENCOUNTER — Ambulatory Visit (INDEPENDENT_AMBULATORY_CARE_PROVIDER_SITE_OTHER): Payer: 59 | Admitting: Internal Medicine

## 2011-12-17 VITALS — BP 136/80 | HR 72 | Temp 98.2°F | Resp 16 | Ht 66.0 in | Wt 225.0 lb

## 2011-12-17 DIAGNOSIS — R748 Abnormal levels of other serum enzymes: Secondary | ICD-10-CM

## 2011-12-17 DIAGNOSIS — R7401 Elevation of levels of liver transaminase levels: Secondary | ICD-10-CM

## 2011-12-17 DIAGNOSIS — Z23 Encounter for immunization: Secondary | ICD-10-CM

## 2011-12-17 DIAGNOSIS — Z0000 Encounter for general adult medical examination without abnormal findings: Secondary | ICD-10-CM

## 2011-12-17 MED ORDER — OLMESARTAN MEDOXOMIL-HCTZ 20-12.5 MG PO TABS: 1.0000 | ORAL_TABLET | Freq: Every day | ORAL | Status: DC

## 2011-12-17 NOTE — Progress Notes (Signed)
Subjective:    Patient ID: Erica Burke, female    DOB: [birth year], 66 y.o.   MRN: [id number]  HPI    Review of Systems  Constitutional: Negative for activity change, appetite change and fatigue.  HENT: Negative for ear pain, congestion, neck pain, postnasal drip and sinus pressure.   Eyes: Negative for redness and visual disturbance.  Respiratory: Negative for cough, shortness of breath and wheezing.   Gastrointestinal: Negative for abdominal pain and abdominal distention.  Genitourinary: Negative for dysuria, frequency and menstrual problem.  Musculoskeletal: Negative for myalgias, joint swelling and arthralgias.  Skin: Negative for rash and wound.  Neurological: Negative for dizziness, weakness and headaches.  Hematological: Negative for adenopathy. Does not bruise/bleed easily.  Psychiatric/Behavioral: Negative for disturbed wake/sleep cycle and decreased concentration.   Past Medical History  Diagnosis Date  . Hyperlipidemia   . Sleep apnea   . Hypertension     History   Social History  . Marital Status: Single    Spouse Name: N/A    Number of Children: N/A  . Years of Education: N/A   Occupational History  . Not on file.   Social History Main Topics  . Smoking status: Never Smoker   . Smokeless tobacco: Not on file  . Alcohol Use: No  . Drug Use: No  . Sexually Active: Not on file   Other Topics Concern  . Not on file   Social History Narrative  . No narrative on file    Past Surgical History  Procedure Date  . Abdominal hysterectomy   . Flexible bronchoscopy 2001    Family History  Problem Relation Age of Onset  . Stroke Mother   . Stroke Father   . Hypertension Other     Allergies  Allergen Reactions  . Codeine Sulfate     REACTION: unspecified  . Tetanus-Diphtheria Toxoids Td     REACTION: unspecified    Current Outpatient Prescriptions on File Prior to Visit  Medication Sig Dispense Refill  . calcium carbonate 1250 MG capsule Take  1,250 mg by mouth daily.        . Multiple Vitamin (MULTIVITAMIN) capsule Take 1 capsule by mouth daily.        . Omega-3 Fatty Acids (FISH OIL) 1000 MG CAPS Take by mouth.        . DISCONTD: BENICAR HCT 20-12.5 MG per tablet TAKE 1 TABLET BY MOUTH DAILY.  30 tablet  6    BP 136/80  Pulse 72  Temp 98.2 F (36.8 C)  Resp 16  Ht 5\' 6"  (1.676 m)  Wt 225 lb (102.059 kg)  BMI 36.32 kg/m2       Objective:   Physical Exam  Nursing note and vitals reviewed. Constitutional: She is oriented to person, place, and time. She appears well-developed and well-nourished. No distress.  HENT:  Head: Normocephalic and atraumatic.  Right Ear: External ear normal.  Left Ear: External ear normal.  Nose: Nose normal.  Mouth/Throat: Oropharynx is clear and moist.  Eyes: Conjunctivae and EOM are normal. Pupils are equal, round, and reactive to light.  Neck: Normal range of motion. Neck supple. No JVD present. No tracheal deviation present. No thyromegaly present.  Cardiovascular: Normal rate, regular rhythm, normal heart sounds and intact distal pulses.   No murmur heard. Pulmonary/Chest: Effort normal and breath sounds normal. She has no wheezes. She exhibits no tenderness.  Abdominal: Soft. Bowel sounds are normal.  Musculoskeletal: Normal range of motion. She exhibits no edema and no  tenderness.  Lymphadenopathy:    She has no cervical adenopathy.  Neurological: She is alert and oriented to person, place, and time. She has normal reflexes. No cranial nerve deficit.  Skin: Skin is warm and dry. She is not diaphoretic.  Psychiatric: She has a normal mood and affect. Her behavior is normal.          Assessment & Plan:

## 2012-03-16 ENCOUNTER — Other Ambulatory Visit: Payer: Self-pay | Admitting: *Deleted

## 2012-03-16 MED ORDER — OLMESARTAN MEDOXOMIL-HCTZ 20-12.5 MG PO TABS: 1.0000 | ORAL_TABLET | Freq: Every day | ORAL | Status: DC

## 2012-06-18 ENCOUNTER — Ambulatory Visit: Payer: 59 | Admitting: Internal Medicine

## 2012-09-10 ENCOUNTER — Ambulatory Visit: Payer: Self-pay | Admitting: Internal Medicine

## 2012-12-15 ENCOUNTER — Ambulatory Visit: Payer: Self-pay | Admitting: Internal Medicine

## 2013-01-19 ENCOUNTER — Encounter: Payer: Self-pay | Admitting: Internal Medicine

## 2013-01-19 ENCOUNTER — Ambulatory Visit (INDEPENDENT_AMBULATORY_CARE_PROVIDER_SITE_OTHER): Payer: 59 | Admitting: Internal Medicine

## 2013-01-19 VITALS — BP 120/80 | HR 68 | Temp 98.2°F | Resp 16 | Ht 66.0 in | Wt 232.0 lb

## 2013-01-19 DIAGNOSIS — R7401 Elevation of levels of liver transaminase levels: Secondary | ICD-10-CM

## 2013-01-19 DIAGNOSIS — E785 Hyperlipidemia, unspecified: Secondary | ICD-10-CM

## 2013-01-19 DIAGNOSIS — I10 Essential (primary) hypertension: Secondary | ICD-10-CM

## 2013-01-19 LAB — HEPATIC FUNCTION PANEL
AST: 30 U/L (ref 0–37)
Albumin: 4.1 g/dL (ref 3.5–5.2)
Alkaline Phosphatase: 50 U/L (ref 39–117)
Bilirubin, Direct: 0.1 mg/dL (ref 0.0–0.3)
Total Bilirubin: 0.6 mg/dL (ref 0.3–1.2)

## 2013-01-19 NOTE — Progress Notes (Signed)
  Subjective:    Patient ID: Erica Burke, female    DOB: 02/25/1946, 67 y.o.   MRN: [id number]  HPI Weight up.... Fell off the paleo    Review of Systems  Constitutional: Negative for activity change, appetite change and fatigue.  HENT: Negative for ear pain, congestion, neck pain, postnasal drip and sinus pressure.   Eyes: Negative for redness and visual disturbance.  Respiratory: Negative for cough, shortness of breath and wheezing.   Gastrointestinal: Negative for abdominal pain and abdominal distention.  Genitourinary: Negative for dysuria, frequency and menstrual problem.  Musculoskeletal: Negative for myalgias, joint swelling and arthralgias.  Skin: Negative for rash and wound.  Neurological: Negative for dizziness, weakness and headaches.  Hematological: Negative for adenopathy. Does not bruise/bleed easily.  Psychiatric/Behavioral: Negative for sleep disturbance and decreased concentration.       Objective:   Physical Exam  Constitutional: She is oriented to person, place, and time. She appears well-developed and well-nourished. No distress.  HENT:  Head: Normocephalic and atraumatic.  Right Ear: External ear normal.  Left Ear: External ear normal.  Nose: Nose normal.  Mouth/Throat: Oropharynx is clear and moist.  Eyes: Conjunctivae and EOM are normal. Pupils are equal, round, and reactive to light.  Neck: Normal range of motion. Neck supple. No JVD present. No tracheal deviation present. No thyromegaly present.  Cardiovascular: Normal rate, regular rhythm, normal heart sounds and intact distal pulses.   No murmur heard. Pulmonary/Chest: Effort normal and breath sounds normal. She has no wheezes. She exhibits no tenderness.  Abdominal: Soft. Bowel sounds are normal.  Musculoskeletal: Normal range of motion. She exhibits no edema and no tenderness.  Lymphadenopathy:    She has no cervical adenopathy.  Neurological: She is alert and oriented to person, place, and time.  She has normal reflexes. No cranial nerve deficit.  Skin: Skin is warm and dry. She is not diaphoretic.  Psychiatric: She has a normal mood and affect. Her behavior is normal.          Assessment & Plan:  Larey Seat off the diet wagon Has bee on reduced blood pressure Stress is reduced Has occasional hypotension  Trial off Blood pressure  She will monitor her blood pressure at least 2 readings a week for the next 4 weeks to make sure that is not trending upward if we see a slight increase in blood pressure we may add back a diuretic she is going to resume a diet and weight loss lifestyle

## 2013-01-19 NOTE — Patient Instructions (Signed)
Sign up for my chart and then notify me what your blood pressures are doing in the next 3-4 weeks at least one or 2 blood pressures a week If her blood pressure starts to creep up and my recommendation would be to add a low-dose diuretic such as hydrochlorothiazide alone

## 2013-06-14 ENCOUNTER — Other Ambulatory Visit: Payer: 59

## 2013-06-28 ENCOUNTER — Encounter: Payer: Self-pay | Admitting: Family Medicine

## 2013-06-28 ENCOUNTER — Ambulatory Visit (INDEPENDENT_AMBULATORY_CARE_PROVIDER_SITE_OTHER): Payer: 59 | Admitting: Family Medicine

## 2013-06-28 VITALS — BP 110/80 | Temp 99.0°F | Ht 66.0 in | Wt 234.0 lb

## 2013-06-28 DIAGNOSIS — B9789 Other viral agents as the cause of diseases classified elsewhere: Principal | ICD-10-CM

## 2013-06-28 DIAGNOSIS — J069 Acute upper respiratory infection, unspecified: Secondary | ICD-10-CM

## 2013-06-28 MED ORDER — FLUTICASONE PROPIONATE 50 MCG/ACT NA SUSP: 1.0000 | Freq: Every day | NASAL | Status: DC

## 2013-06-28 MED ORDER — HYDROCODONE-HOMATROPINE 5-1.5 MG/5ML PO SYRP: 5.0000 mL | ORAL_SOLUTION | Freq: Three times a day (TID) | ORAL | Status: DC | PRN

## 2013-06-28 NOTE — Patient Instructions (Signed)
Drink lots of water  Vaporizer  Hydromet,,,,,,,,, 1/2-1 teaspoon 3 times daily. For cough and cold  At bedtime take one shot of afrin nasal spray up each nostril weight for 5 minutes then use one shot of a steroid nasal spray up each nostril  After 5 nights stop the afrin, but continue the steroid nasal spray until the cold has resolved

## 2013-06-28 NOTE — Progress Notes (Signed)
Pre visit review using our clinic review tool, if applicable. No additional management support is needed unless otherwise documented below in the visit note. 

## 2013-06-28 NOTE — Progress Notes (Signed)
   Subjective:    Patient ID: Erica Burke, female    DOB: [birth year], 68 y.o.   MRN: [id number]  HPI Erica Burke is a delightful 68 year old married female nonsmoker who comes in with a three-day history of head congestion sore throat and cough  She states for the past 3 days she's had head congestion postnasal drip and cough. No fever chills nausea vomiting or diarrhea. She's never had any problems in the past when she's had a cold. No history of asthma.   Review of Systems Review of systems negative except that her grandson had similar symptoms a week ago    Objective:   Physical Exam  Well-developed and nourished female no acute distress HEENT were pertinent she has scars on both eardrums with evidence of perforations in the past that healed over. Neck was supple no adenopathy lungs are clear      Assessment & Plan:  Viral syndrome plan treat symptomatically see orders

## 2013-07-01 ENCOUNTER — Other Ambulatory Visit: Payer: 59

## 2013-07-05 ENCOUNTER — Other Ambulatory Visit (INDEPENDENT_AMBULATORY_CARE_PROVIDER_SITE_OTHER): Payer: 59

## 2013-07-05 DIAGNOSIS — Z0000 Encounter for general adult medical examination without abnormal findings: Secondary | ICD-10-CM

## 2013-07-05 LAB — POCT URINALYSIS DIPSTICK
Blood, UA: NEGATIVE
Glucose, UA: NEGATIVE
Nitrite, UA: NEGATIVE
SPEC GRAV UA: 1.02
UROBILINOGEN UA: 1
pH, UA: 6

## 2013-07-05 LAB — CBC WITH DIFFERENTIAL/PLATELET
BASOS PCT: 0.3 % (ref 0.0–3.0)
Basophils Absolute: 0 10*3/uL (ref 0.0–0.1)
EOS PCT: 2.1 % (ref 0.0–5.0)
Eosinophils Absolute: 0.1 10*3/uL (ref 0.0–0.7)
HEMATOCRIT: 38.6 % (ref 36.0–46.0)
HEMOGLOBIN: 13 g/dL (ref 12.0–15.0)
LYMPHS ABS: 1.9 10*3/uL (ref 0.7–4.0)
Lymphocytes Relative: 33.5 % (ref 12.0–46.0)
MCHC: 33.7 g/dL (ref 30.0–36.0)
MCV: 90 fl (ref 78.0–100.0)
MONOS PCT: 10.5 % (ref 3.0–12.0)
Monocytes Absolute: 0.6 10*3/uL (ref 0.1–1.0)
NEUTROS ABS: 3.1 10*3/uL (ref 1.4–7.7)
Neutrophils Relative %: 53.6 % (ref 43.0–77.0)
Platelets: 272 10*3/uL (ref 150.0–400.0)
RBC: 4.29 Mil/uL (ref 3.87–5.11)
RDW: 13.7 % (ref 11.5–14.6)
WBC: 5.8 10*3/uL (ref 4.5–10.5)

## 2013-07-05 LAB — LIPID PANEL
CHOLESTEROL: 153 mg/dL (ref 0–200)
HDL: 25.8 mg/dL — ABNORMAL LOW (ref >39.00)
LDL CALC: 100 mg/dL — AB (ref 0–99)
Total CHOL/HDL Ratio: 6
Triglycerides: 135 mg/dL (ref 0.0–149.0)
VLDL: 27 mg/dL (ref 0.0–40.0)

## 2013-07-05 LAB — HEPATIC FUNCTION PANEL
ALBUMIN: 3.8 g/dL (ref 3.5–5.2)
ALT: 34 U/L (ref 0–35)
AST: 36 U/L (ref 0–37)
Alkaline Phosphatase: 50 U/L (ref 39–117)
Bilirubin, Direct: 0.1 mg/dL (ref 0.0–0.3)
Total Bilirubin: 0.6 mg/dL (ref 0.3–1.2)
Total Protein: 7.9 g/dL (ref 6.0–8.3)

## 2013-07-05 LAB — BASIC METABOLIC PANEL
BUN: 11 mg/dL (ref 6–23)
CO2: 29 mEq/L (ref 19–32)
Calcium: 9.2 mg/dL (ref 8.4–10.5)
Chloride: 104 mEq/L (ref 96–112)
Creatinine, Ser: 0.9 mg/dL (ref 0.4–1.2)
GFR: 67.16 mL/min (ref >60.00)
Glucose, Bld: 102 mg/dL — ABNORMAL HIGH (ref 70–99)
POTASSIUM: 3.5 meq/L (ref 3.5–5.1)
SODIUM: 140 meq/L (ref 135–145)

## 2013-07-05 LAB — TSH: TSH: 0.91 u[IU]/mL (ref 0.35–5.50)

## 2013-07-08 ENCOUNTER — Ambulatory Visit (INDEPENDENT_AMBULATORY_CARE_PROVIDER_SITE_OTHER): Payer: 59 | Admitting: Internal Medicine

## 2013-07-08 ENCOUNTER — Encounter: Payer: Self-pay | Admitting: Internal Medicine

## 2013-07-08 ENCOUNTER — Other Ambulatory Visit (HOSPITAL_COMMUNITY)
Admission: RE | Admit: 2013-07-08 | Discharge: 2013-07-08 | Disposition: A | Discharge status: Home / Self Care | Payer: 59 | Source: Ambulatory Visit | Attending: Internal Medicine | Admitting: Internal Medicine

## 2013-07-08 VITALS — BP 130/80 | HR 76 | Temp 98.2°F | Resp 16 | Ht 66.0 in | Wt 232.0 lb

## 2013-07-08 DIAGNOSIS — J309 Allergic rhinitis, unspecified: Secondary | ICD-10-CM

## 2013-07-08 DIAGNOSIS — Z124 Encounter for screening for malignant neoplasm of cervix: Secondary | ICD-10-CM | POA: Insufficient documentation

## 2013-07-08 DIAGNOSIS — K0381 Cracked tooth: Secondary | ICD-10-CM

## 2013-07-08 DIAGNOSIS — Z0000 Encounter for general adult medical examination without abnormal findings: Secondary | ICD-10-CM

## 2013-07-08 DIAGNOSIS — J302 Other seasonal allergic rhinitis: Secondary | ICD-10-CM

## 2013-07-08 MED ORDER — METHYLPREDNISOLONE ACETATE 80 MG/ML IJ SUSP: 80.0000 mg | INTRAMUSCULAR | Status: AC

## 2013-07-08 MED ORDER — AMOXICILLIN 500 MG PO CAPS: 500.0000 mg | ORAL_CAPSULE | Freq: Three times a day (TID) | ORAL | Status: DC

## 2013-07-08 NOTE — Addendum Note (Signed)
Addended by: Allyne Gee on: 07/08/2013 03:54 PM   Modules accepted: Orders

## 2013-07-08 NOTE — Progress Notes (Signed)
Subjective:    Patient ID: Erica Burke, female    DOB: [birth year], 68 y.o.   MRN: [id number]  HPI CPX weight loss Diet planning    Review of Systems  Constitutional: Negative for activity change, appetite change and fatigue.  HENT: Negative for congestion, ear pain, postnasal drip and sinus pressure.   Eyes: Negative for redness and visual disturbance.  Respiratory: Negative for cough, shortness of breath and wheezing.   Gastrointestinal: Negative for abdominal pain and abdominal distention.  Genitourinary: Negative for dysuria, frequency and menstrual problem.  Musculoskeletal: Negative for arthralgias, joint swelling, myalgias and neck pain.  Skin: Negative for rash and wound.  Neurological: Negative for dizziness, weakness and headaches.  Hematological: Negative for adenopathy. Does not bruise/bleed easily.  Psychiatric/Behavioral: Negative for sleep disturbance and decreased concentration.   Past Medical History  Diagnosis Date  . Hyperlipidemia   . Sleep apnea   . Hypertension     History   Social History  . Marital Status: Single    Spouse Name: N/A    Number of Children: N/A  . Years of Education: N/A   Occupational History  . Not on file.   Social History Main Topics  . Smoking status: Never Smoker   . Smokeless tobacco: Not on file  . Alcohol Use: No  . Drug Use: No  . Sexual Activity: Not on file   Other Topics Concern  . Not on file   Social History Narrative  . No narrative on file    Past Surgical History  Procedure Laterality Date  . Abdominal hysterectomy    . Flexible bronchoscopy  2001    Family History  Problem Relation Age of Onset  . Stroke Mother   . Stroke Father   . Hypertension Other     Allergies  Allergen Reactions  . Codeine Sulfate     REACTION: unspecified  . Tetanus-Diphtheria Toxoids Td     REACTION: unspecified    Current Outpatient Prescriptions on File Prior to Visit  Medication Sig Dispense Refill  .  calcium carbonate 1250 MG capsule Take 1,250 mg by mouth daily.        . fluticasone (FLONASE) 50 MCG/ACT nasal spray Place 1 spray into both nostrils daily.  16 g  6  . Multiple Vitamin (MULTIVITAMIN) capsule Take 1 capsule by mouth daily.        . Omega-3 Fatty Acids (FISH OIL) 1000 MG CAPS Take by mouth.         No current facility-administered medications on file prior to visit.    BP 130/80  Pulse 76  Temp(Src) 98.2 F (36.8 C)  Resp 16  Ht 5\' 6"  (1.676 m)  Wt 232 lb (105.235 kg)  BMI 37.46 kg/m2       Objective:   Physical Exam  Nursing note reviewed. Constitutional: She is oriented to person, place, and time. She appears well-developed and well-nourished. No distress.  HENT:  Head: Normocephalic and atraumatic.  Right Ear: External ear normal.  Left Ear: External ear normal.  Nose: Nose normal.  Mouth/Throat: Oropharynx is clear and moist.  Eyes: Conjunctivae and EOM are normal. Pupils are equal, round, and reactive to light.  Neck: Normal range of motion. Neck supple. No JVD present. No tracheal deviation present. No thyromegaly present.  Cardiovascular: Normal rate, regular rhythm, normal heart sounds and intact distal pulses.   No murmur heard. Pulmonary/Chest: Effort normal and breath sounds normal. She has no wheezes. She exhibits no tenderness.  Abdominal:  Soft. Bowel sounds are normal.  Musculoskeletal: Normal range of motion. She exhibits no edema and no tenderness.  Lymphadenopathy:    She has no cervical adenopathy.  Neurological: She is alert and oriented to person, place, and time. She has normal reflexes. No cranial nerve deficit.  Skin: Skin is warm and dry. She is not diaphoretic.  Psychiatric: She has a normal mood and affect. Her behavior is normal.          Assessment & Plan:  Prediabetes risk  This is a routine physical examination for this healthy  Female. Reviewed all health maintenance protocols including mammography colonoscopy bone  density and reviewed appropriate screening labs. Her immunization history was reviewed as well as her current medications and allergies refills of her chronic medications were given and the plan for yearly health maintenance was discussed all orders and referrals were made as appropriate.

## 2013-07-08 NOTE — Addendum Note (Signed)
Addended by: Allyne Gee on: 07/08/2013 06:08 PM   Modules accepted: Orders

## 2013-07-08 NOTE — Progress Notes (Signed)
Pre visit review using our clinic review tool, if applicable. No additional management support is needed unless otherwise documented below in the visit note. 

## 2013-07-08 NOTE — Addendum Note (Signed)
Addended by: Ricard Dillon on: 07/08/2013 03:37 PM   Modules accepted: Orders

## 2013-07-08 NOTE — Patient Instructions (Signed)
-   Weight loss 

## 2013-09-26 ENCOUNTER — Telehealth: Payer: Self-pay | Admitting: Internal Medicine

## 2013-09-26 NOTE — Telephone Encounter (Signed)
Pt's husband is calling in regards to pt dropping off some paperwork about 2 weeks ago. The paperwork was to get her doctors approval to give plasma because of her age. Calling to get update if form is filled out yet.

## 2013-09-27 NOTE — Telephone Encounter (Signed)
LMTCB

## 2013-09-29 NOTE — Telephone Encounter (Signed)
Form completed and faxed. 

## 2013-12-09 ENCOUNTER — Telehealth: Payer: Self-pay | Admitting: Internal Medicine

## 2013-12-09 MED ORDER — OLMESARTAN MEDOXOMIL-HCTZ 20-12.5 MG PO TABS: ORAL_TABLET | ORAL | Status: DC

## 2013-12-09 NOTE — Telephone Encounter (Signed)
Ok per Dr. Arnoldo Morale to call in rx.

## 2013-12-09 NOTE — Telephone Encounter (Signed)
Pt stated her bp has been elevated 156/80 and would like to re-start her bp med benicar hct 20-12.5mg . Please call new rx into [address]

## 2013-12-09 NOTE — Telephone Encounter (Signed)
Rx sent to pharmacy.  Called and spoke with pt and pt is aware.  

## 2014-06-28 ENCOUNTER — Encounter: Payer: Self-pay | Admitting: Family Medicine

## 2014-06-28 ENCOUNTER — Ambulatory Visit (INDEPENDENT_AMBULATORY_CARE_PROVIDER_SITE_OTHER): Payer: 59 | Admitting: Family Medicine

## 2014-06-28 VITALS — BP 137/70 | HR 83 | Temp 98.0°F | Ht 66.0 in | Wt 240.0 lb

## 2014-06-28 DIAGNOSIS — J0100 Acute maxillary sinusitis, unspecified: Secondary | ICD-10-CM

## 2014-06-28 MED ORDER — AZITHROMYCIN 250 MG PO TABS: ORAL_TABLET | ORAL | Status: DC

## 2014-06-28 MED ORDER — HYDROCODONE-HOMATROPINE 5-1.5 MG/5ML PO SYRP: 5.0000 mL | ORAL_SOLUTION | ORAL | Status: DC | PRN

## 2014-06-28 MED ORDER — OLMESARTAN MEDOXOMIL-HCTZ 20-12.5 MG PO TABS
ORAL_TABLET | ORAL | Status: DC
Start: 2014-06-28 — End: 2014-12-01

## 2014-06-28 NOTE — Progress Notes (Signed)
Pre visit review using our clinic review tool, if applicable. No additional management support is needed unless otherwise documented below in the visit note. 

## 2014-06-28 NOTE — Progress Notes (Signed)
   Subjective:    Patient ID: Erica Burke, female    DOB: 08/05/1945, 69 y.o.   MRN: [id number]  HPI Here for 4 days of sinus pressure, PND, ST, and a dry cough.   Review of Systems  Constitutional: Negative.   HENT: Positive for congestion, postnasal drip and sinus pressure.   Eyes: Negative.   Respiratory: Positive for cough. Negative for shortness of breath and wheezing.        Objective:   Physical Exam  Constitutional: She appears well-developed and well-nourished.  HENT:  Right Ear: External ear normal.  Left Ear: External ear normal.  Nose: Nose normal.  Mouth/Throat: Oropharynx is clear and moist.  Eyes: Conjunctivae are normal.  Pulmonary/Chest: Effort normal and breath sounds normal.  Lymphadenopathy:    She has no cervical adenopathy.          Assessment & Plan:  Add Mucinex

## 2014-07-04 ENCOUNTER — Telehealth: Payer: Self-pay | Admitting: Internal Medicine

## 2014-07-04 NOTE — Telephone Encounter (Signed)
Pt is leaving work please call cell

## 2014-07-04 NOTE — Telephone Encounter (Signed)
Pt was seen on 06-28-14 for sinus inf and pt is no better. Pt would like another zpak call into [address]

## 2014-07-05 ENCOUNTER — Other Ambulatory Visit: Payer: 59

## 2014-07-05 MED ORDER — AZITHROMYCIN 250 MG PO TABS: ORAL_TABLET | ORAL | Status: DC

## 2014-07-05 NOTE — Telephone Encounter (Signed)
Call in another Zpack  

## 2014-07-05 NOTE — Telephone Encounter (Signed)
I sent script e-scribe and spoke with pt. 

## 2014-07-12 ENCOUNTER — Encounter: Payer: 59 | Admitting: Internal Medicine

## 2014-10-25 ENCOUNTER — Other Ambulatory Visit: Payer: Self-pay

## 2014-10-25 DIAGNOSIS — Z1231 Encounter for screening mammogram for malignant neoplasm of breast: Secondary | ICD-10-CM

## 2014-11-10 ENCOUNTER — Ambulatory Visit: Payer: Self-pay | Admitting: Family Medicine

## 2014-11-17 ENCOUNTER — Ambulatory Visit
Admission: RE | Admit: 2014-11-17 | Discharge: 2014-11-17 | Disposition: A | Discharge status: Home / Self Care | Payer: 59 | Source: Ambulatory Visit

## 2014-11-17 DIAGNOSIS — Z1231 Encounter for screening mammogram for malignant neoplasm of breast: Secondary | ICD-10-CM

## 2014-11-28 ENCOUNTER — Other Ambulatory Visit: Payer: Self-pay | Admitting: Podiatry

## 2014-11-28 ENCOUNTER — Ambulatory Visit (INDEPENDENT_AMBULATORY_CARE_PROVIDER_SITE_OTHER): Payer: 59

## 2014-11-28 ENCOUNTER — Ambulatory Visit (INDEPENDENT_AMBULATORY_CARE_PROVIDER_SITE_OTHER): Payer: 59 | Admitting: Podiatry

## 2014-11-28 ENCOUNTER — Encounter: Payer: Self-pay | Admitting: Podiatry

## 2014-11-28 VITALS — BP 138/78 | HR 56 | Temp 96.9°F | Resp 16

## 2014-11-28 DIAGNOSIS — M79675 Pain in left toe(s): Secondary | ICD-10-CM

## 2014-11-28 DIAGNOSIS — M79674 Pain in right toe(s): Secondary | ICD-10-CM

## 2014-11-28 DIAGNOSIS — M2040 Other hammer toe(s) (acquired), unspecified foot: Secondary | ICD-10-CM

## 2014-11-28 NOTE — Patient Instructions (Signed)
Pre-Operative Instructions  Congratulations, you have decided to take an important step to improving your quality of life.  You can be assured that the doctors of [facility] will be with you every step of the way.  1. Plan to be at the surgery center/hospital at least 1 (one) hour prior to your scheduled time unless otherwise directed by the surgical center/hospital staff.  You must have a responsible adult accompany you, remain during the surgery and drive you home.  Make sure you have directions to the surgical center/hospital and know how to get there on time. 2. For hospital based surgery you will need to obtain a history and physical form from your family physician within 1 month prior to the date of surgery- we will give you a form for you primary physician.  3. We make every effort to accommodate the date you request for surgery.  There are however, times where surgery dates or times have to be moved.  We will contact you as soon as possible if a change in schedule is required.   4. No Aspirin/Ibuprofen for one week before surgery.  If you are on aspirin, any non-steroidal anti-inflammatory medications (Mobic, Aleve, Ibuprofen) you should stop taking it 7 days prior to your surgery.  You make take Tylenol  For pain prior to surgery.  5. Medications- If you are taking daily heart and blood pressure medications, seizure, reflux, allergy, asthma, anxiety, pain or diabetes medications, make sure the surgery center/hospital is aware before the day of surgery so they may notify you which medications to take or avoid the day of surgery. 6. No food or drink after midnight the night before surgery unless directed otherwise by surgical center/hospital staff. 7. No alcoholic beverages 24 hours prior to surgery.  No smoking 24 hours prior to or 24 hours after surgery. 8. Wear loose pants or shorts- loose enough to fit over bandages, boots, and casts. 9. No slip on shoes, sneakers are best. 10. Bring  your boot with you to the surgery center/hospital.  Also bring crutches or a walker if your physician has prescribed it for you.  If you do not have this equipment, it will be provided for you after surgery. 11. If you have not been contracted by the surgery center/hospital by the day before your surgery, call to confirm the date and time of your surgery. 12. Leave-time from work may vary depending on the type of surgery you have.  Appropriate arrangements should be made prior to surgery with your employer. 13. Prescriptions will be provided immediately following surgery by your doctor.  Have these filled as soon as possible after surgery and take the medication as directed. 14. Remove nail polish on the operative foot. 15. Wash the night before surgery.  The night before surgery wash the foot and leg well with the antibacterial soap provided and water paying special attention to beneath the toenails and in between the toes.  Rinse thoroughly with water and dry well with a towel.  Perform this wash unless told not to do so by your physician.  Enclosed: 1 Ice pack (please put in freezer the night before surgery)   1 Hibiclens skin cleaner   Pre-op Instructions  If you have any questions regarding the instructions, do not hesitate to call our office.  [place]: [address] [place], [place] [address] [phone]  [place]: [address] [place], [place] [address] [phone]  [place]: [address]  [place], [place] [address] [phone]  Dr. Richard   Tuchman DPM, Dr. Norman Regal DPM Dr. Richard Sikora DPM, Dr. M. Todd Ariam Mol DPM, Dr. Kathryn Egerton DPM 

## 2014-11-28 NOTE — Progress Notes (Signed)
° °  Subjective:    Patient ID: Erica Burke, female    DOB: 1945-12-23, 69 y.o.   MRN: [id number]  HPI  N: Dull L: B/L foot both 1st toe D: Months O: Gradual C: Irritating, annoying A: Walking T: Nothing    Review of Systems  All other systems reviewed and are negative.      Objective:   Physical Exam: I have reviewed her past medical history medications allergy surgeries and social history. Pulses are strongly palpable bilaterally. Neurologic system is intact. Deep tendon reflexes and muscle strength equal in strong bilateral. Orthopedic evaluation of a straight awl was distal to the ankle for range of motion without crepitus hammertoe deformity second bilateral right greater than left with angular deformity at the PIPJ fixed in position on dorsiflexion. Limited with plantar flexion as well. Radial grafts confirmed this.        Assessment & Plan:  Assessment: Hammertoe deformity second bilateral.  Plan: We discussed the etiology pathology conservative versus surgical therapies. Discussed surgical correction of hammertoe deformities bilateral. Discussed pros and cons of the procedures. Discussed the etiology pathology inserted versus surgical therapies were discussed the possible postop complications which may include but are not limited to postop pain bleeding as well as infection recurrence need for further surgery. We discussed the possibility of loss of digit muscle limb loss of life. And I will follow-up with her in near future for surgery.

## 2014-12-01 ENCOUNTER — Ambulatory Visit (INDEPENDENT_AMBULATORY_CARE_PROVIDER_SITE_OTHER): Payer: 59 | Admitting: Family Medicine

## 2014-12-01 ENCOUNTER — Encounter: Payer: Self-pay | Admitting: Family Medicine

## 2014-12-01 ENCOUNTER — Other Ambulatory Visit: Payer: Self-pay | Admitting: Family Medicine

## 2014-12-01 VITALS — BP 124/72 | HR 69 | Temp 98.3°F | Wt 236.0 lb

## 2014-12-01 DIAGNOSIS — I10 Essential (primary) hypertension: Secondary | ICD-10-CM

## 2014-12-01 DIAGNOSIS — Z1211 Encounter for screening for malignant neoplasm of colon: Secondary | ICD-10-CM | POA: Diagnosis not present

## 2014-12-01 DIAGNOSIS — E785 Hyperlipidemia, unspecified: Secondary | ICD-10-CM | POA: Diagnosis not present

## 2014-12-01 DIAGNOSIS — Z23 Encounter for immunization: Secondary | ICD-10-CM

## 2014-12-01 DIAGNOSIS — E669 Obesity, unspecified: Secondary | ICD-10-CM

## 2014-12-01 DIAGNOSIS — E66812 Obesity, class 2: Secondary | ICD-10-CM

## 2014-12-01 DIAGNOSIS — J309 Allergic rhinitis, unspecified: Secondary | ICD-10-CM | POA: Insufficient documentation

## 2014-12-01 DIAGNOSIS — R739 Hyperglycemia, unspecified: Secondary | ICD-10-CM

## 2014-12-01 DIAGNOSIS — Z780 Asymptomatic menopausal state: Secondary | ICD-10-CM

## 2014-12-01 MED ORDER — OLMESARTAN MEDOXOMIL-HCTZ 20-12.5 MG PO TABS: ORAL_TABLET | ORAL | Status: DC

## 2014-12-01 NOTE — Assessment & Plan Note (Signed)
S: mild LDL elevation, HDL low. 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 Trouble with portion control A/P: counseling provided on need for weight loss to help avoid lipid medication.

## 2014-12-01 NOTE — Assessment & Plan Note (Signed)
S: monitors her BP and if ill, may take 1/2 or no tablet. Most of the time takes 1/2 tablet. No BPs at home >140/90. controlled A/P: continue current rx- benicar-hct 20-12.5mg  -most of time takes 1/2 tab

## 2014-12-01 NOTE — Assessment & Plan Note (Signed)
S: minimal exercise as noted. Has free [employer] membership for 3 months through insurance due to hyperglycemia A/P: advised increase exercise to 150 minutes a week, watch portion control, healthy diet, needs to work on weight loss. 6 months follow up. a1c when returns for labs.

## 2014-12-01 NOTE — Patient Instructions (Addendum)
We will call you within a week about your referral to GI for colonoscopy. If you do not hear within 2 weeks, give [place] a call.   Schedule bone density at front desk  Schedule a lab visit at the front desk. Return for future fasting labs. Nothing but water after midnight please.   Follow up in 6 months. Let's do a physical at least a year out from your labs this year.   Refilled meds for a year

## 2014-12-01 NOTE — Progress Notes (Signed)
Garret Reddish, MD Phone: [phone]  Subjective:  Patient presents today to establish care with me as their new primary care provider. Patient was formerly a patient of Dr. Arnoldo Morale. Chief complaint-noted.   See problem oriented charting- ROS- no chest pain, shortness of breath, nausea, vomiting, abdominal pain, edema  The following were reviewed and entered/updated in epic: Past Medical History  Diagnosis Date  . Hyperlipidemia   . Sleep apnea   . Hypertension   . TRANSAMINASES, SERUM, ELEVATED 10/19/2008    Mild elevations - resolved     Patient Active Problem List   Diagnosis Date Noted  . Hyperglycemia 12/01/2014    Priority: Medium  . Essential hypertension 10/19/2008    Priority: Medium  . Hyperlipidemia 01/25/2007    Priority: Medium  . Allergic rhinitis 12/01/2014    Priority: Low  . Obesity, Class II, BMI 35-39.9 04/19/2010    Priority: Low  . OSA on CPAP 07/11/2008    Priority: Low   Past Surgical History  Procedure Laterality Date  . Abdominal hysterectomy      severe bleeding, still has ovaries  . Flexible sigmoidoscopy  2001  . Cataract surgery      bilateral    Family History  Problem Relation Age of Onset  . Stroke Mother   . Stroke Father   . Hypertension Other   . Lung cancer Brother     x2 smokers  . Scleroderma Brother     "died from thrombosis"  . Other Sister     spinal muscular atrophy- 1 sister, 2 brothers    Medications- reviewed and updated Current Outpatient Prescriptions  Medication Sig Dispense Refill  . calcium carbonate 1250 MG capsule Take 1,250 mg by mouth daily.      . Multiple Vitamin (MULTIVITAMIN) capsule Take 1 capsule by mouth daily.      Marland Kitchen olmesartan-hydrochlorothiazide (BENICAR HCT) 20-12.5 MG per tablet TAKE 1 TABLET BY MOUTH DAILY. (Patient taking differently: TAKE 1/2 TABLET BY MOUTH DAILY.) 30 tablet 5  . Omega-3 Fatty Acids (FISH OIL) 1000 MG CAPS Take by mouth.      . fluticasone (FLONASE) 50 MCG/ACT nasal  spray Place 1 spray into both nostrils daily. (Patient not taking: Reported on 06/28/2014) 16 g 6   No current facility-administered medications for this visit.    Allergies-reviewed and updated Allergies  Allergen Reactions  . Codeine Sulfate     REACTION: unspecified  . Tetanus-Diphtheria Toxoids Td     REACTION: unspecified    History   Social History  . Marital Status: Single    Spouse Name: N/A  . Number of Children: N/A  . Years of Education: N/A   Social History Main Topics  . Smoking status: Never Smoker   . Smokeless tobacco: Never Used  . Alcohol Use: No  . Drug Use: No  . Sexual Activity: Not on file   Other Topics Concern  . Not on file   Social History Narrative   Married (49 years in [facility] patient of Dr. Yong Channel). 3 children. 5 grandkids.       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 Oropharynx normal Neck: no thyromegaly CV: RRR no murmurs rubs or gallops Lungs: CTAB no crackles, wheeze, rhonchi  Abdomen: soft/nontender/nondistended/normal bowel sounds. No rebound or guarding.  Ext: no edema Skin: warm, dry Neuro: grossly normal, moves all extremities, PERRLA  Assessment/Plan:  Hyperlipidemia S: mild LDL elevation, HDL low. 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 Trouble with portion control A/P: counseling provided on need for weight loss to help avoid lipid medication.    Essential hypertension S: monitors her BP and if ill, may take 1/2 or no tablet. Most of the time takes 1/2 tablet. No BPs at home >140/90. controlled A/P: continue current rx- benicar-hct 20-12.5mg  -most of time takes 1/2 tab   Hyperglycemia S: minimal exercise as noted. Has free [facility] membership for 3 months through insurance due to  hyperglycemia A/P: advised increase exercise to 150 minutes a week, watch portion control, healthy diet, needs to work on weight loss. 6 months follow up. a1c when returns for labs.    Obesity, Class II, BMI 35-39.9 S: discussed links to all medical illnesses. Poor control. Needs wieght loss A/P: Encouraged need for healthy eating, regular exercise, weight loss.   6 month BP check. CPE 1 year from labs.   Health Maintenance Due  Topic Date Due  . COLONOSCOPY - ordered 02/08/1996  . DEXA SCAN - ordered and asked patient to schedule at front desk 02/08/2011   Future fasting labs Orders Placed This Encounter  Procedures  . DG Bone Density    Standing Status: Future     Number of Occurrences:      Standing Expiration Date: 01/31/2016    Order Specific Question:  Reason for Exam (SYMPTOM  OR DIAGNOSIS REQUIRED)    Answer:  postmenopausal, screening osteoporosis    Order Specific Question:  Preferred imaging location?    Answer:  [facility]  . Pneumococcal conjugate vaccine 13-valent  . CBC    [place]    Standing Status: Future     Number of Occurrences:      Standing Expiration Date: 12/01/2015  . Comprehensive metabolic panel    [place]    Standing Status: Future     Number of Occurrences:      Standing Expiration Date: 12/01/2015    Order Specific Question:  Has the patient fasted?    Answer:  No  . Lipid panel    [place]    Standing Status: Future     Number of Occurrences:      Standing Expiration Date: 12/01/2015    Order Specific Question:  Has the patient fasted?    Answer:  No  . TSH    [place]    Standing Status: Future     Number of Occurrences:      Standing Expiration Date: 12/01/2015  . Hemoglobin A1c    [place]    Standing Status: Future     Number of Occurrences:      Standing Expiration Date: 12/01/2015  . Ambulatory referral to Gastroenterology    Referral Priority:  Routine    Referral Type:  Consultation    Referral Reason:  Specialty Services  Required    Number of Visits Requested:  1    Meds ordered this encounter  Medications  . olmesartan-hydrochlorothiazide (BENICAR HCT) 20-12.5 MG per tablet    Sig: TAKE 1 TABLET BY MOUTH DAILY.    Dispense:  90 tablet    Refill:  3   >50% of 25 minute office visit was spent on counseling as noted above and coordination of care

## 2014-12-01 NOTE — Assessment & Plan Note (Signed)
S: discussed links to all medical illnesses. Poor control. Needs wieght loss A/P: Encouraged need for healthy eating, regular exercise, weight loss.

## 2014-12-05 ENCOUNTER — Inpatient Hospital Stay: Admission: RE | Admit: 2014-12-05 | Payer: 59 | Source: Ambulatory Visit

## 2014-12-12 ENCOUNTER — Encounter: Payer: Self-pay | Admitting: Internal Medicine

## 2014-12-12 ENCOUNTER — Other Ambulatory Visit: Payer: 59

## 2015-01-17 ENCOUNTER — Ambulatory Visit (AMBULATORY_SURGERY_CENTER): Payer: Self-pay

## 2015-01-17 VITALS — Ht 66.5 in | Wt 233.0 lb

## 2015-01-17 DIAGNOSIS — Z1211 Encounter for screening for malignant neoplasm of colon: Secondary | ICD-10-CM

## 2015-01-17 MED ORDER — NA SULFATE-K SULFATE-MG SULF 17.5-3.13-1.6 GM/177ML PO SOLN: 1.0000 | Freq: Once | ORAL | Status: DC

## 2015-01-17 NOTE — Progress Notes (Signed)
No hx of anesthesia complications Not on home 02 Pt not taking diet drugs  No egg or soy allergies

## 2015-01-18 ENCOUNTER — Encounter: Payer: Self-pay | Admitting: Internal Medicine

## 2015-01-23 ENCOUNTER — Other Ambulatory Visit (INDEPENDENT_AMBULATORY_CARE_PROVIDER_SITE_OTHER): Payer: 59

## 2015-01-23 ENCOUNTER — Other Ambulatory Visit: Payer: 59

## 2015-01-23 DIAGNOSIS — E785 Hyperlipidemia, unspecified: Secondary | ICD-10-CM | POA: Diagnosis not present

## 2015-01-23 DIAGNOSIS — R739 Hyperglycemia, unspecified: Secondary | ICD-10-CM | POA: Diagnosis not present

## 2015-01-23 DIAGNOSIS — I10 Essential (primary) hypertension: Secondary | ICD-10-CM | POA: Diagnosis not present

## 2015-01-23 LAB — COMPREHENSIVE METABOLIC PANEL
ALBUMIN: 4.2 g/dL (ref 3.5–5.2)
ALT: 34 U/L (ref 0–35)
AST: 33 U/L (ref 0–37)
Alkaline Phosphatase: 48 U/L (ref 39–117)
BILIRUBIN TOTAL: 0.4 mg/dL (ref 0.2–1.2)
BUN: 17 mg/dL (ref 6–23)
CHLORIDE: 104 meq/L (ref 96–112)
CO2: 28 meq/L (ref 19–32)
CREATININE: 0.85 mg/dL (ref 0.40–1.20)
Calcium: 9.5 mg/dL (ref 8.4–10.5)
GFR: 70.49 mL/min (ref >60.00)
Glucose, Bld: 94 mg/dL (ref 70–99)
POTASSIUM: 4.1 meq/L (ref 3.5–5.1)
Sodium: 138 mEq/L (ref 135–145)
Total Protein: 7.7 g/dL (ref 6.0–8.3)

## 2015-01-23 LAB — CBC
HEMATOCRIT: 37.4 % (ref 36.0–46.0)
Hemoglobin: 12.6 g/dL (ref 12.0–15.0)
MCHC: 33.6 g/dL (ref 30.0–36.0)
MCV: 91.1 fl (ref 78.0–100.0)
Platelets: 243 10*3/uL (ref 150.0–400.0)
RBC: 4.11 Mil/uL (ref 3.87–5.11)
RDW: 14 % (ref 11.5–15.5)
WBC: 6.4 10*3/uL (ref 4.0–10.5)

## 2015-01-23 LAB — LIPID PANEL
Cholesterol: 186 mg/dL (ref 0–200)
HDL: 36.1 mg/dL — ABNORMAL LOW (ref >39.00)
LDL Cholesterol: 115 mg/dL — ABNORMAL HIGH (ref 0–99)
NonHDL: 150.24
Total CHOL/HDL Ratio: 5
Triglycerides: 176 mg/dL — ABNORMAL HIGH (ref 0.0–149.0)
VLDL: 35.2 mg/dL (ref 0.0–40.0)

## 2015-01-23 LAB — TSH: TSH: 1.76 u[IU]/mL (ref 0.35–4.50)

## 2015-01-23 LAB — HEMOGLOBIN A1C: Hgb A1c MFr Bld: 6.1 % (ref 4.6–6.5)

## 2015-01-30 ENCOUNTER — Other Ambulatory Visit: Payer: Self-pay | Admitting: Family Medicine

## 2015-01-30 DIAGNOSIS — Z780 Asymptomatic menopausal state: Secondary | ICD-10-CM

## 2015-01-31 ENCOUNTER — Encounter: Payer: Self-pay | Admitting: Internal Medicine

## 2015-01-31 ENCOUNTER — Ambulatory Visit (AMBULATORY_SURGERY_CENTER): Payer: 59 | Admitting: Internal Medicine

## 2015-01-31 VITALS — BP 122/81 | HR 56 | Temp 96.1°F | Resp 35 | Ht 66.5 in | Wt 233.0 lb

## 2015-01-31 DIAGNOSIS — Z1211 Encounter for screening for malignant neoplasm of colon: Secondary | ICD-10-CM

## 2015-01-31 DIAGNOSIS — D122 Benign neoplasm of ascending colon: Secondary | ICD-10-CM | POA: Diagnosis not present

## 2015-01-31 MED ORDER — SODIUM CHLORIDE 0.9 % IV SOLN: 500.0000 mL | INTRAVENOUS | Status: DC

## 2015-01-31 NOTE — Op Note (Signed)
[facility]  [address] [place], [address]   COLONOSCOPY PROCEDURE REPORT  PATIENT: Erica Burke, Erica Burke  MR#: [id number] BIRTHDATE: [birth year] , 68  yrs. old GENDER: female ENDOSCOPIST: Eustace Quail, MD REFERRED VA:POLIDCV Kristian Covey, M.D. PROCEDURE DATE:  01/31/2015 PROCEDURE:   Colonoscopy, screening and Colonoscopy with snare polypectomy x 1 First Screening Colonoscopy - Avg.  risk and is 50 yrs.  old or older Yes.  Prior Negative Screening - Now for repeat screening. N/A  History of Adenoma - Now for follow-up colonoscopy & has been > or = to 3 yrs.  N/A  Polyps removed today? Yes ASA CLASS:   Class II INDICATIONS:Screening for colonic neoplasia and Colorectal Neoplasm Risk Assessment for this procedure is average risk. MEDICATIONS: Monitored anesthesia care and Propofol 300 mg IV  DESCRIPTION OF PROCEDURE:   After the risks benefits and alternatives of the procedure were thoroughly explained, informed consent was obtained.  The digital rectal exam revealed no abnormalities of the rectum.   The LB [id number]  endoscope was introduced through the anus and advanced to the cecum, which was identified by both the appendix and ileocecal valve. No adverse events experienced.   The quality of the prep was excellent. (Suprep was used)  The instrument was then slowly withdrawn as the colon was fully examined. Estimated blood loss is zero unless otherwise noted in this procedure report.     COLON FINDINGS: A single polyp measuring 8 mm in size was found in the ascending colon.  A polypectomy was performed with a cold snare.  The resection was complete, the polyp tissue was completely retrieved and sent to histology.   There was moderate diverticulosis noted in the sigmoid colon, as well as an inverted diverticulum in the transverse colon.   The examination was otherwise normal.  Retroflexed views revealed internal hemorrhoids. The time to cecum = 2.9 Withdrawal  time = 9.7   The scope was withdrawn and the procedure completed. COMPLICATIONS: There were no immediate complications.  ENDOSCOPIC IMPRESSION: 1.   Single polyp was found in the ascending colon; polypectomy was performed with a cold snare 2.   Moderate diverticulosis was noted in the sigmoid colon 3.   The examination was otherwise normal  RECOMMENDATIONS: 1. Follow up colonoscopy in 5 years  eSigned:  Eustace Quail, MD 01/31/2015 1:20 PM   cc: The Patient and Marin Olp

## 2015-01-31 NOTE — Patient Instructions (Signed)
YOU HAD AN ENDOSCOPIC PROCEDURE TODAY AT [facility]:   Refer to the procedure report that was given to you for any specific questions about what was found during the examination.  If the procedure report does not answer your questions, please call your gastroenterologist to clarify.  If you requested that your care partner not be given the details of your procedure findings, then the procedure report has been included in a sealed envelope for you to review at your convenience later.  YOU SHOULD EXPECT: Some feelings of bloating in the abdomen. Passage of more gas than usual.  Walking can help get rid of the air that was put into your GI tract during the procedure and reduce the bloating. If you had a lower endoscopy (such as a colonoscopy or flexible sigmoidoscopy) you may notice spotting of blood in your stool or on the toilet paper. If you underwent a bowel prep for your procedure, you may not have a normal bowel movement for a few days.  Please Note:  You might notice some irritation and congestion in your nose or some drainage.  This is from the oxygen used during your procedure.  There is no need for concern and it should clear up in a day or so.  SYMPTOMS TO REPORT IMMEDIATELY:   Following lower endoscopy (colonoscopy or flexible sigmoidoscopy):  Excessive amounts of blood in the stool  Significant tenderness or worsening of abdominal pains  Swelling of the abdomen that is new, acute  Fever of 100F or higher    For urgent or emergent issues, a gastroenterologist can be reached at any hour by calling [phone].   DIET: Your first meal following the procedure should be a small meal and then it is ok to progress to your normal diet. Heavy or fried foods are harder to digest and may make you feel nauseous or bloated.  Likewise, meals heavy in dairy and vegetables can increase bloating.  Drink plenty of fluids but you should avoid alcoholic beverages for 24  hours.  ACTIVITY:  You should plan to take it easy for the rest of today and you should NOT DRIVE or use heavy machinery until tomorrow (because of the sedation medicines used during the test).    FOLLOW UP: Our staff will call the number listed on your records the next business day following your procedure to check on you and address any questions or concerns that you may have regarding the information given to you following your procedure. If we do not reach you, we will leave a message.  However, if you are feeling well and you are not experiencing any problems, there is no need to return our call.  We will assume that you have returned to your regular daily activities without incident.  If any biopsies were taken you will be contacted by phone or by letter within the next 1-3 weeks.  Please call us at [phone] if you have not heard about the biopsies in 3 weeks.    SIGNATURES/CONFIDENTIALITY: You and/or your care partner have signed paperwork which will be entered into your electronic medical record.  These signatures attest to the fact that that the information above on your After Visit Summary has been reviewed and is understood.  Full responsibility of the confidentiality of this discharge information lies with you and/or your care-partner.  Polyp, hemorrhoid, diverticulosis and high fiber diet information given.

## 2015-01-31 NOTE — Progress Notes (Signed)
Called to room to assist during endoscopic procedure.  Patient ID and intended procedure confirmed with present staff. Received instructions for my participation in the procedure from the performing physician.  

## 2015-01-31 NOTE — Progress Notes (Signed)
Report to PACU, RN, vss, BBS= Clear.  

## 2015-02-01 ENCOUNTER — Telehealth: Payer: Self-pay | Admitting: *Deleted

## 2015-02-01 NOTE — Telephone Encounter (Signed)
  Follow up Call-  Call back number 01/31/2015  Post procedure Call Back phone  # [phone]  Permission to leave phone message Yes    Pt gone, spoke with husband Patient questions:  Do you have a fever, pain , or abdominal swelling? No. Pain Score  0 *  Have you tolerated food without any problems? Yes.    Have you been able to return to your normal activities? Yes.    Do you have any questions about your discharge instructions: Diet   No. Medications  No. Follow up visit  No.  Do you have questions or concerns about your Care? No.  Actions: * If pain score is 4 or above: No action needed, pain <4.

## 2015-02-05 ENCOUNTER — Encounter: Payer: Self-pay | Admitting: Internal Medicine

## 2015-02-06 ENCOUNTER — Encounter: Payer: Self-pay | Admitting: Family Medicine

## 2015-02-06 DIAGNOSIS — D126 Benign neoplasm of colon, unspecified: Secondary | ICD-10-CM | POA: Insufficient documentation

## 2015-03-09 LAB — HM DEXA SCAN

## 2015-05-04 ENCOUNTER — Telehealth: Payer: Self-pay | Admitting: *Deleted

## 2015-05-04 NOTE — Telephone Encounter (Signed)
"  Give me a call on Monday at work to schedule surgery."

## 2015-05-07 NOTE — Telephone Encounter (Signed)
"  I may have left a message on Friday.  If I did I apologize.  I found out about insurance information I needed to know.  I'm changing from [facility] to [facility].  So if you would give me a call please.  I'd like to schedule surgery for bilateral."

## 2015-05-09 NOTE — Telephone Encounter (Signed)
"  I to find out what I need to do to schedule surgery."

## 2015-05-15 NOTE — Telephone Encounter (Signed)
I called and left her a message to call and schedule a consultation with Dr. Milinda Pointer.

## 2015-05-24 ENCOUNTER — Encounter: Payer: Self-pay | Admitting: Podiatry

## 2015-05-24 ENCOUNTER — Ambulatory Visit (INDEPENDENT_AMBULATORY_CARE_PROVIDER_SITE_OTHER): Payer: Managed Care, Other (non HMO) | Admitting: Podiatry

## 2015-05-24 DIAGNOSIS — M2040 Other hammer toe(s) (acquired), unspecified foot: Secondary | ICD-10-CM

## 2015-05-24 NOTE — Progress Notes (Signed)
She presents today with chief complaint of painful second toes bilaterally. She states they've been bothering her for quite some time in the irritated her with shoe gear. She states that really know she was comfortable other than an open toed shoe. She denies changes in her past medical history medications allergies surgery social history.  Objective: Vital signs are stable she is alert and oriented 3. Pulses are strongly palpable neurologic sensorium is intact. Deep tendon reflexes are intact bilateral and muscle strength is intact bilateral. Orthopedic evaluation of a straight solid joints distal to the ankle range of motion without crepitus. Upon loading the forefoot is noted that she has rigid hammertoe deformities with flexure contractures which are rigid at the PIPJ. The metatarsophalangeal joint portion appears to be rectus slightly tight. Radiographic evaluation and straight slightly elongated second metatarsals. Flexor contractures at the PIPJ joint space narrowing and subchondral sclerosis indicative of osteoarthritic changes at the PIPJ second digit bilateral. Cutaneous evaluation and straight supple hydrated cutis no open lesions mild erythema overlying the PIPJ second digit bilateral.  Assessment: Hammertoe deformities second bilateral.  Plan: Discussed etiology pathology conservative versus surgical therapies. At this point I recommended hammertoe repair with screw fixation second digits bilateral. I explained to her that this would be considered an arthrodesis or fusion of the toes and the screws can be removed. We discussed this in great detail today we went over the consent form line by line number by number giving her a couple times and asked questions she saw fit regarding hammertoe repair second digit bilateral with screw fixation. I answered all questions regarding these procedures to the best of my ability and lens terms. She understood this was amenable to it and signed all 3 pages of  the consent form. We did discuss the possible postop complications which may include but are not limited to postop pain bleeding swelling infection recurrence need for further surgery also digit also limb loss of life. I will follow-up with her the near future for surgical intervention. We dispensed to her shoes today for postop recovery.

## 2015-05-24 NOTE — Patient Instructions (Signed)
Pre-Operative Instructions  Congratulations, you have decided to take an important step to improving your quality of life.  You can be assured that the doctors of [facility] will be with you every step of the way.  1. Plan to be at the surgery center/hospital at least 1 (one) hour prior to your scheduled time unless otherwise directed by the surgical center/hospital staff.  You must have a responsible adult accompany you, remain during the surgery and drive you home.  Make sure you have directions to the surgical center/hospital and know how to get there on time. 2. For hospital based surgery you will need to obtain a history and physical form from your family physician within 1 month prior to the date of surgery- we will give you a form for you primary physician.  3. We make every effort to accommodate the date you request for surgery.  There are however, times where surgery dates or times have to be moved.  We will contact you as soon as possible if a change in schedule is required.   4. No Aspirin/Ibuprofen for one week before surgery.  If you are on aspirin, any non-steroidal anti-inflammatory medications (Mobic, Aleve, Ibuprofen) you should stop taking it 7 days prior to your surgery.  You make take Tylenol  For pain prior to surgery.  5. Medications- If you are taking daily heart and blood pressure medications, seizure, reflux, allergy, asthma, anxiety, pain or diabetes medications, make sure the surgery center/hospital is aware before the day of surgery so they may notify you which medications to take or avoid the day of surgery. 6. No food or drink after midnight the night before surgery unless directed otherwise by surgical center/hospital staff. 7. No alcoholic beverages 24 hours prior to surgery.  No smoking 24 hours prior to or 24 hours after surgery. 8. Wear loose pants or shorts- loose enough to fit over bandages, boots, and casts. 9. No slip on shoes, sneakers are best. 10. Bring  your boot with you to the surgery center/hospital.  Also bring crutches or a walker if your physician has prescribed it for you.  If you do not have this equipment, it will be provided for you after surgery. 11. If you have not been contracted by the surgery center/hospital by the day before your surgery, call to confirm the date and time of your surgery. 12. Leave-time from work may vary depending on the type of surgery you have.  Appropriate arrangements should be made prior to surgery with your employer. 13. Prescriptions will be provided immediately following surgery by your doctor.  Have these filled as soon as possible after surgery and take the medication as directed. 14. Remove nail polish on the operative foot. 15. Wash the night before surgery.  The night before surgery wash the foot and leg well with the antibacterial soap provided and water paying special attention to beneath the toenails and in between the toes.  Rinse thoroughly with water and dry well with a towel.  Perform this wash unless told not to do so by your physician.  Enclosed: 1 Ice pack (please put in freezer the night before surgery)   1 Hibiclens skin cleaner   Pre-op Instructions  If you have any questions regarding the instructions, do not hesitate to call our office.  [place]: [address] [place], [place] [address] [phone]  [place]: [address] [place], [place] [address] [phone]  [place]: [address]  [place], [place] [address] [phone]  Dr. Richard   Tuchman DPM, Dr. Norman Regal DPM Dr. Richard Sikora DPM, Dr. M. Todd Hyatt DPM, Dr. Kathryn Egerton DPM 

## 2015-06-04 ENCOUNTER — Telehealth: Payer: Self-pay | Admitting: *Deleted

## 2015-06-04 NOTE — Telephone Encounter (Signed)
"  I have surgery scheduled for bilateral Hammer Toe.  My nephew was killed Saturday with a hit and run.  I need to do it on the 30th.  Give me a call at work or my cell."  Caren Griffins from [facility] called and stated, "Michea Crees canceled her surgery.  She has a death in the family."  I'm returning your call.  At this moment he doesn't have anything available on the 30th.  "Can you put me on a list if there's any cancellations?"  Yes, I will.

## 2015-06-04 NOTE — Telephone Encounter (Signed)
We are very full.  Probably will be a cxl .  Please put her on the list. Sorry about her nephew.Marland Kitchen

## 2015-06-05 ENCOUNTER — Ambulatory Visit: Payer: Managed Care, Other (non HMO) | Admitting: Podiatry

## 2015-06-13 ENCOUNTER — Telehealth: Payer: Self-pay | Admitting: *Deleted

## 2015-06-13 NOTE — Telephone Encounter (Signed)
"  I calling to let her know that Dr. Milinda Pointer had a cancellation for 06/15/2015 for surgery.  "Okay, that is good to know.  She is out of town right now for a death in the family.  When can it be done?"  He can do it this Friday.  "That might be a bit of a problem.  She has a funeral to attend on Saturday.  Let me give her a call and call you right back."

## 2015-06-13 NOTE — Telephone Encounter (Signed)
ok 

## 2015-06-13 NOTE — Telephone Encounter (Signed)
"  I just spoke to my wife.  It's going to be too much of a time crunch.  She's not going to make it back in time.  She's in [place].  Thanks for giving [place] the opportunity."  You're welcome.

## 2015-06-14 ENCOUNTER — Encounter: Payer: Self-pay | Admitting: Podiatry

## 2015-06-21 ENCOUNTER — Encounter: Payer: Self-pay | Admitting: Podiatry

## 2015-07-25 ENCOUNTER — Ambulatory Visit (INDEPENDENT_AMBULATORY_CARE_PROVIDER_SITE_OTHER): Payer: Managed Care, Other (non HMO) | Admitting: Adult Health

## 2015-07-25 ENCOUNTER — Encounter: Payer: Self-pay | Admitting: Adult Health

## 2015-07-25 VITALS — BP 104/68 | HR 65 | Temp 98.6°F | Ht 66.5 in | Wt 240.3 lb

## 2015-07-25 DIAGNOSIS — J0110 Acute frontal sinusitis, unspecified: Secondary | ICD-10-CM | POA: Diagnosis not present

## 2015-07-25 MED ORDER — DOXYCYCLINE HYCLATE 100 MG PO CAPS: 100.0000 mg | ORAL_CAPSULE | Freq: Two times a day (BID) | ORAL | Status: DC

## 2015-07-25 NOTE — Progress Notes (Signed)
Pre visit review using our clinic review tool, if applicable. No additional management support is needed unless otherwise documented below in the visit note. 

## 2015-07-25 NOTE — Progress Notes (Signed)
Subjective:    Patient ID: Erica Burke, female    DOB: 12/16/1945, 70 y.o.   MRN: [id number]  HPI  70 year old female who presents to the office with the complaint of  sinus pain and pressure, PND, ST and dry cough. This has been an ongoing issue since the beginning of January.   Denies fever, nausea, vomiting, diarrhea.    Review of Systems  HENT: Positive for congestion, postnasal drip, sinus pressure and sore throat.   Respiratory: Positive for cough. Negative for chest tightness, shortness of breath and wheezing.   All other systems reviewed and are negative.  Past Medical History  Diagnosis Date  . Hyperlipidemia   . Hypertension   . TRANSAMINASES, SERUM, ELEVATED 10/19/2008    Mild elevations - resolved    . Sleep apnea     uses cpap    Social History   Social History  . Marital Status: Single    Spouse Name: N/A  . Number of Children: N/A  . Years of Education: N/A   Occupational History  . Not on file.   Social History Main Topics  . Smoking status: Never Smoker   . Smokeless tobacco: Never Used  . Alcohol Use: No  . Drug Use: No  . Sexual Activity: Not on file   Other Topics Concern  . Not on file   Social History Narrative   Married (49 years in 2016[facility] patient of Dr. Yong Channel). 3 children. 5 grandkids.       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      Hobbies: time with grandkids, [place]    Past Surgical History  Procedure Laterality Date  . Abdominal hysterectomy      severe bleeding, still has ovaries  . Flexible sigmoidoscopy  2001  . Cataract surgery      bilateral    Family History  Problem Relation Age of Onset  . Stroke Mother   . Stroke Father   . Hypertension Other   . Lung cancer Brother     x2 smokers  . Scleroderma Brother     "died from thrombosis"  . Other Sister     spinal muscular atrophy- 1 sister, 2 brothers  . Colon cancer Neg Hx     Allergies  Allergen Reactions  . Codeine  Sulfate     REACTION: unspecified  . Tetanus-Diphtheria Toxoids Td     REACTION: unspecified    Current Outpatient Prescriptions on File Prior to Visit  Medication Sig Dispense Refill  . Multiple Vitamin (MULTIVITAMIN) capsule Take 1 capsule by mouth daily.      Marland Kitchen olmesartan-hydrochlorothiazide (BENICAR HCT) 20-12.5 MG per tablet TAKE 1 TABLET BY MOUTH DAILY. (Patient taking differently: Take 0.5 tablets by mouth daily. TAKE 1 TABLET BY MOUTH DAILY.) 90 tablet 3  . Omega-3 Fatty Acids (FISH OIL) 1000 MG CAPS Take by mouth.       No current facility-administered medications on file prior to visit.    BP 104/68 mmHg  Pulse 65  Temp(Src) 98.6 F (37 C) (Oral)  Ht 5' 6.5" (1.689 m)  Wt 240 lb 4.8 oz (108.999 kg)  BMI 38.21 kg/m2  SpO2 95%       Objective:   Physical Exam  Constitutional: She is oriented to person, place, and time. She appears well-developed and well-nourished. No distress.  HENT:  Head: Normocephalic and atraumatic.  Right Ear: External ear normal.  Left Ear: External ear normal.  Nose: Nose normal.  Mouth/Throat: Oropharynx is clear and moist. No oropharyngeal exudate.  Eyes: Conjunctivae and EOM are normal. Pupils are equal, round, and reactive to light. Right eye exhibits no discharge. Left eye exhibits no discharge. No scleral icterus.  Cardiovascular: Normal rate, regular rhythm, normal heart sounds and intact distal pulses.  Exam reveals no gallop and no friction rub.   No murmur heard. Pulmonary/Chest: Effort normal and breath sounds normal. No respiratory distress. She has no wheezes. She has no rales. She exhibits no tenderness.  Neurological: She is alert and oriented to person, place, and time.  Skin: Skin is warm and dry. No rash noted. She is not diaphoretic. No erythema. No pallor.  Psychiatric: She has a normal mood and affect. Her behavior is normal. Judgment and thought content normal.  Nursing note and vitals reviewed.      Assessment &  Plan:  1. Subacute frontal sinusitis - doxycycline (VIBRAMYCIN) 100 MG capsule; Take 1 capsule (100 mg total) by mouth 2 (two) times daily.  Dispense: 14 capsule; Refill: 0 - Add flonase - Increase fluids - Rest - Follow up if no improvement in 2-3 days

## 2015-07-25 NOTE — Patient Instructions (Addendum)
It was great meeting you today!  I am sorry you are feeling bad.   I have sent in a prescription for Doxycycline, take this twice a day for 7 days.   Add Flonase   Follow up if no improvement in the next 2-3 days.   Sinusitis, Adult Sinusitis is redness, soreness, and inflammation of the paranasal sinuses. Paranasal sinuses are air pockets within the bones of your face. They are located beneath your eyes, in the middle of your forehead, and above your eyes. In healthy paranasal sinuses, mucus is able to drain out, and air is able to circulate through them by way of your nose. However, when your paranasal sinuses are inflamed, mucus and air can become trapped. This can allow bacteria and other germs to grow and cause infection. Sinusitis can develop quickly and last only a short time (acute) or continue over a long period (chronic). Sinusitis that lasts for more than 12 weeks is considered chronic. CAUSES Causes of sinusitis include:  Allergies.  Structural abnormalities, such as displacement of the cartilage that separates your nostrils (deviated septum), which can decrease the air flow through your nose and sinuses and affect sinus drainage.  Functional abnormalities, such as when the small hairs (cilia) that line your sinuses and help remove mucus do not work properly or are not present. SIGNS AND SYMPTOMS Symptoms of acute and chronic sinusitis are the same. The primary symptoms are pain and pressure around the affected sinuses. Other symptoms include:  Upper toothache.  Earache.  Headache.  Bad breath.  Decreased sense of smell and taste.  A cough, which worsens when you are lying flat.  Fatigue.  Fever.  Thick drainage from your nose, which often is green and may contain pus (purulent).  Swelling and warmth over the affected sinuses. DIAGNOSIS Your health care provider will perform a physical exam. During your exam, your health care provider may perform any of the  following to help determine if you have acute sinusitis or chronic sinusitis:  Look in your nose for signs of abnormal growths in your nostrils (nasal polyps).  Tap over the affected sinus to check for signs of infection.  View the inside of your sinuses using an imaging device that has a light attached (endoscope). If your health care provider suspects that you have chronic sinusitis, one or more of the following tests may be recommended:  Allergy tests.  Nasal culture. A sample of mucus is taken from your nose, sent to a lab, and screened for bacteria.  Nasal cytology. A sample of mucus is taken from your nose and examined by your health care provider to determine if your sinusitis is related to an allergy. TREATMENT Most cases of acute sinusitis are related to a viral infection and will resolve on their own within 10 days. Sometimes, medicines are prescribed to help relieve symptoms of both acute and chronic sinusitis. These may include pain medicines, decongestants, nasal steroid sprays, or saline sprays. However, for sinusitis related to a bacterial infection, your health care provider will prescribe antibiotic medicines. These are medicines that will help kill the bacteria causing the infection. Rarely, sinusitis is caused by a fungal infection. In these cases, your health care provider will prescribe antifungal medicine. For some cases of chronic sinusitis, surgery is needed. Generally, these are cases in which sinusitis recurs more than 3 times per year, despite other treatments. HOME CARE INSTRUCTIONS  Drink plenty of water. Water helps thin the mucus so your sinuses can drain more  easily.  Use a humidifier.  Inhale steam 3-4 times a day (for example, sit in the bathroom with the shower running).  Apply a warm, moist washcloth to your face 3-4 times a day, or as directed by your health care provider.  Use saline nasal sprays to help moisten and clean your sinuses.  Take  medicines only as directed by your health care provider.  If you were prescribed either an antibiotic or antifungal medicine, finish it all even if you start to feel better. SEEK IMMEDIATE MEDICAL CARE IF:  You have increasing pain or severe headaches.  You have nausea, vomiting, or drowsiness.  You have swelling around your face.  You have vision problems.  You have a stiff neck.  You have difficulty breathing.   This information is not intended to replace advice given to you by your health care provider. Make sure you discuss any questions you have with your health care provider.   Document Released: 06/02/2005 Document Revised: 06/23/2014 Document Reviewed: 06/17/2011 Elsevier Interactive Patient Education [employer].

## 2015-12-27 ENCOUNTER — Other Ambulatory Visit (INDEPENDENT_AMBULATORY_CARE_PROVIDER_SITE_OTHER): Payer: Managed Care, Other (non HMO)

## 2015-12-27 DIAGNOSIS — Z0000 Encounter for general adult medical examination without abnormal findings: Secondary | ICD-10-CM

## 2015-12-27 LAB — TSH: TSH: 1.24 u[IU]/mL (ref 0.35–4.50)

## 2015-12-27 LAB — POC URINALSYSI DIPSTICK (AUTOMATED)
BILIRUBIN UA: NEGATIVE
Blood, UA: NEGATIVE
Glucose, UA: NEGATIVE
KETONES UA: NEGATIVE
LEUKOCYTES UA: NEGATIVE
Nitrite, UA: NEGATIVE
Protein, UA: NEGATIVE
Spec Grav, UA: 1.015
Urobilinogen, UA: 0.2
pH, UA: 6.5

## 2015-12-27 LAB — CBC WITH DIFFERENTIAL/PLATELET
BASOS ABS: 0 10*3/uL (ref 0.0–0.1)
Basophils Relative: 0.5 % (ref 0.0–3.0)
Eosinophils Absolute: 0.3 10*3/uL (ref 0.0–0.7)
Eosinophils Relative: 4.7 % (ref 0.0–5.0)
HCT: 37.5 % (ref 36.0–46.0)
Hemoglobin: 12.7 g/dL (ref 12.0–15.0)
LYMPHS ABS: 1.8 10*3/uL (ref 0.7–4.0)
Lymphocytes Relative: 26.9 % (ref 12.0–46.0)
MCHC: 33.9 g/dL (ref 30.0–36.0)
MCV: 90.2 fl (ref 78.0–100.0)
MONO ABS: 0.7 10*3/uL (ref 0.1–1.0)
MONOS PCT: 10.5 % (ref 3.0–12.0)
NEUTROS PCT: 57.4 % (ref 43.0–77.0)
Neutro Abs: 3.9 10*3/uL (ref 1.4–7.7)
Platelets: 271 10*3/uL (ref 150.0–400.0)
RBC: 4.16 Mil/uL (ref 3.87–5.11)
RDW: 13.8 % (ref 11.5–15.5)
WBC: 6.9 10*3/uL (ref 4.0–10.5)

## 2015-12-27 LAB — BASIC METABOLIC PANEL
BUN: 19 mg/dL (ref 6–23)
CALCIUM: 9.8 mg/dL (ref 8.4–10.5)
CO2: 27 mEq/L (ref 19–32)
CREATININE: 0.84 mg/dL (ref 0.40–1.20)
Chloride: 103 mEq/L (ref 96–112)
GFR: 71.27 mL/min (ref >60.00)
GLUCOSE: 107 mg/dL — AB (ref 70–99)
Potassium: 4.1 mEq/L (ref 3.5–5.1)
Sodium: 140 mEq/L (ref 135–145)

## 2015-12-27 LAB — HEPATIC FUNCTION PANEL
ALK PHOS: 50 U/L (ref 39–117)
ALT: 38 U/L — AB (ref 0–35)
AST: 35 U/L (ref 0–37)
Albumin: 4.2 g/dL (ref 3.5–5.2)
BILIRUBIN DIRECT: 0.1 mg/dL (ref 0.0–0.3)
TOTAL PROTEIN: 7.5 g/dL (ref 6.0–8.3)
Total Bilirubin: 0.6 mg/dL (ref 0.2–1.2)

## 2015-12-27 LAB — LIPID PANEL
CHOLESTEROL: 206 mg/dL — AB (ref 0–200)
HDL: 36.3 mg/dL — AB (ref >39.00)
LDL Cholesterol: 134 mg/dL — ABNORMAL HIGH (ref 0–99)
NonHDL: 170.03
Total CHOL/HDL Ratio: 6
Triglycerides: 178 mg/dL — ABNORMAL HIGH (ref 0.0–149.0)
VLDL: 35.6 mg/dL (ref 0.0–40.0)

## 2016-01-01 ENCOUNTER — Ambulatory Visit (INDEPENDENT_AMBULATORY_CARE_PROVIDER_SITE_OTHER): Payer: Managed Care, Other (non HMO) | Admitting: Family Medicine

## 2016-01-01 ENCOUNTER — Encounter: Payer: Self-pay | Admitting: Family Medicine

## 2016-01-01 VITALS — BP 110/64 | HR 78 | Temp 98.3°F | Ht 65.75 in | Wt 234.4 lb

## 2016-01-01 DIAGNOSIS — Z0000 Encounter for general adult medical examination without abnormal findings: Secondary | ICD-10-CM | POA: Diagnosis not present

## 2016-01-01 MED ORDER — OLMESARTAN MEDOXOMIL-HCTZ 20-12.5 MG PO TABS: ORAL_TABLET | ORAL | Status: DC

## 2016-01-01 NOTE — Progress Notes (Signed)
Phone: [phone]  Subjective:  Patient presents today for their annual physical. Chief complaint-noted.   See problem oriented charting- ROS- full  review of systems was completed and negative except for: No chest pain or shortness of breath. No headache or blurry vision.   The following were reviewed and entered/updated in epic: Past Medical History  Diagnosis Date  . Hyperlipidemia   . Hypertension   . TRANSAMINASES, SERUM, ELEVATED 10/19/2008    Mild elevations - resolved    . Sleep apnea     uses cpap   Patient Active Problem List   Diagnosis Date Noted  . Hyperglycemia 12/01/2014    Priority: Medium  . Essential hypertension 10/19/2008    Priority: Medium  . Hyperlipidemia 01/25/2007    Priority: Medium  . Obesity, Class II, BMI 35-39.9 04/19/2010    Priority: Low  . OSA on CPAP 07/11/2008    Priority: Low  . Polyp of colon, adenomatous 02/06/2015   Past Surgical History  Procedure Laterality Date  . Abdominal hysterectomy      severe bleeding, still has ovaries  . Flexible sigmoidoscopy  2001  . Cataract surgery      bilateral    Family History  Problem Relation Age of Onset  . Stroke Mother   . Stroke Father   . Hypertension Other   . Lung cancer Brother     x2 smokers  . Scleroderma Brother     "died from thrombosis"  . Other Sister     spinal muscular atrophy- 1 sister, 2 brothers  . Colon cancer Neg Hx     Medications- reviewed and updated Current Outpatient Prescriptions  Medication Sig Dispense Refill  . Multiple Vitamin (MULTIVITAMIN) capsule Take 1 capsule by mouth daily.      Marland Kitchen olmesartan-hydrochlorothiazide (BENICAR HCT) 20-12.5 MG per tablet TAKE 1 TABLET BY MOUTH DAILY. (Patient taking differently: Take 0.5 tablets by mouth daily. TAKE 1 TABLET BY MOUTH DAILY.) 90 tablet 3  . Omega-3 Fatty Acids (FISH OIL) 1000 MG CAPS Take by mouth.       No current facility-administered medications for this visit.    Allergies-reviewed and  updated Allergies  Allergen Reactions  . Tetanus-Diphtheria Toxoids Td     Severe rash, fever, hospitalized.   . Codeine Sulfate     REACTION: unspecified    Social History   Social History  . Marital Status: Single    Spouse Name: N/A  . Number of Children: N/A  . Years of Education: N/A   Social History Main Topics  . Smoking status: Never Smoker   . Smokeless tobacco: Never Used  . Alcohol Use: No  . Drug Use: No  . Sexual Activity: Not Asked   Other Topics Concern  . None   Social History Narrative   Married (49 years in 2016[facility] patient of Dr. Yong Channel). 3 children. 5 grandkids.       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 Oropharynx normal Neck: no thyromegaly CV: RRR no murmurs rubs or gallops Lungs: CTAB no crackles, wheeze, rhonchi Abdomen: soft/nontender/nondistended/normal bowel sounds. No rebound or guarding.  Ext: no edema Skin: warm,  dry Neuro: grossly normal, moves all extremities, PERRLA  Assessment/Plan:  70 y.o. female presenting for annual physical.  Health Maintenance counseling: 1. Anticipatory guidance: Patient counseled regarding regular dental exams, eye exams (cataract history), wearing seatbelts.  2. Risk factor reduction:  Advised patient of need for regular exercise and diet rich and fruits and vegetables to reduce risk of heart attack and stroke. Patient recently on vacation- knows what she needs to work on but hard to implement 3. Immunizations/screenings/ancillary studies Immunization History  Administered Date(s) Administered  . Influenza-Unspecified 03/16/2014  . Pneumococcal Conjugate-13 12/01/2014  .  Pneumococcal Polysaccharide-23 12/17/2011  . Zoster 12/17/2011   Health Maintenance Due  Topic Date Due  . DEXA SCAN - had this done and will have it sent to [place]. Reports looked good 02/08/2011   4. Cervical cancer screening- aged out per USPTF guidelines with no history abnormal 5. Breast cancer screening-  breast exam declines and mammogram 11/17/2014, will be updating soon 6. Colon cancer screening - 01/2015 with 5 year repeat 7. Skin cancer screening- saw dermatology last year- benign findings  Status of chronic or acute concerns  Hypertension- well controlled on benicar HCT 20-12.5mg  (half tabs at times if gets lower)  HLD-10 year risk 9.5%. Weight largely stable.   Hyperglycemia- CBG 107, slightly up. Had free [employer] membership last time  OSA- using CPAP  Obesity- needs weight loss  Td allergy- has scrape left elbow but cannot get immunization per prior history  Cough/congestion for about 1.5 weeks. Clear rhinorrhea- advised flonase for likely allergies  Return in about 6 months (around 07/03/2016) for weight and BP check.  Return precautions advised.   Garret Reddish, MD

## 2016-01-01 NOTE — Addendum Note (Signed)
Addended by: Marin Olp on: 01/01/2016 09:17 AM   Modules accepted: Orders, SmartSet

## 2016-01-01 NOTE — Progress Notes (Signed)
Pre visit review using our clinic review tool, if applicable. No additional management support is needed unless otherwise documented below in the visit note. 

## 2016-01-01 NOTE — Patient Instructions (Addendum)
Sugar and cholesterol up- probably linked to vacation.   Some ideas to reverse trend: 5: fruits and vegetables per day (work on 9 per day if you are at 5) 4: exercise 4-5 times per week for at least 30 minutes (walking counts!) 3: meals per day (don't skip breakfast!) 0-1: sweet per day (2 cookies, 1 small cup of ice cream, 12 oz soda)  Think you can reverse these changes over the next year. Would love to see you at least 10-15 lbs down next year.   Arm looks ok- great job caring for it. Cannot give tetanus shot per history  For cough/congestion- appears allergic. Let's add flonase for at least 3 weeks- if persistent symptoms, return to see [place]. If worsening before then- return to see [place]. Sounds like your husband should get checked out

## 2016-01-20 ENCOUNTER — Other Ambulatory Visit: Payer: Self-pay | Admitting: Family Medicine

## 2016-07-04 ENCOUNTER — Ambulatory Visit: Payer: Self-pay | Admitting: Family Medicine

## 2016-07-15 ENCOUNTER — Other Ambulatory Visit: Payer: Self-pay

## 2016-07-15 ENCOUNTER — Encounter: Payer: Self-pay | Admitting: Family Medicine

## 2016-07-15 MED ORDER — FLUTICASONE PROPIONATE 50 MCG/ACT NA SUSP: 2.0000 | Freq: Every day | NASAL | 6 refills | Status: DC

## 2016-07-16 ENCOUNTER — Other Ambulatory Visit: Payer: Self-pay | Admitting: Family Medicine

## 2016-07-16 DIAGNOSIS — Z1231 Encounter for screening mammogram for malignant neoplasm of breast: Secondary | ICD-10-CM

## 2016-07-31 ENCOUNTER — Ambulatory Visit (INDEPENDENT_AMBULATORY_CARE_PROVIDER_SITE_OTHER): Payer: BLUE CROSS/BLUE SHIELD | Admitting: Family Medicine

## 2016-07-31 ENCOUNTER — Encounter: Payer: Self-pay | Admitting: Family Medicine

## 2016-07-31 DIAGNOSIS — E785 Hyperlipidemia, unspecified: Secondary | ICD-10-CM

## 2016-07-31 DIAGNOSIS — I10 Essential (primary) hypertension: Secondary | ICD-10-CM

## 2016-07-31 DIAGNOSIS — R739 Hyperglycemia, unspecified: Secondary | ICD-10-CM

## 2016-07-31 NOTE — Assessment & Plan Note (Signed)
S: mild poorly controlled on no statin.  Lab Results  Component Value Date   CHOL 206 (H) 12/27/2015   HDL 36.30 (L) 12/27/2015   LDLCALC 134 (H) 12/27/2015   LDLDIRECT 159.1 01/04/2010   TRIG 178.0 (H) 12/27/2015   CHOLHDL 6 12/27/2015   A/P: 9.5% 10 year risk at last visit- patient has wanted to use a 12% cut off or higher *she is rather firmly opposed to statin use). Also some hyperglycemia so weight loss preferred- unfortunately has not achieved much weight loss

## 2016-07-31 NOTE — Assessment & Plan Note (Signed)
S: controlled on benicar HCTZ 20-12.5mg  .  Sometimes will use half dose if BP lower on home monitoring BP Readings from Last 3 Encounters:  07/31/16 108/70  01/01/16 110/64  07/25/15 104/68  A/P:Continue current medications- excellent control

## 2016-07-31 NOTE — Progress Notes (Signed)
Pre visit review using our clinic review tool, if applicable. No additional management support is needed unless otherwise documented below in the visit note. 

## 2016-07-31 NOTE — Assessment & Plan Note (Signed)
S: Hyperglycemia with sugar at 107 on last check.  A/P: Her motivation seems to be moderate to make changes- she was able to set the following goals- "1. Decrease portion size 2. Concrete goal of 3x a week for half an hour- starting on treadclimber (as long as no chest or shortness of breath) 3. Walking more with better weather "

## 2016-07-31 NOTE — Progress Notes (Signed)
Subjective:  Erica Burke is a 71 y.o. year old very pleasant female patient who presents for/with See problem oriented charting ROS- No chest pain or shortness of breath. No headache or blurry vision. No myalgias   Past Medical History-  Patient Active Problem List   Diagnosis Date Noted  . Hyperglycemia 12/01/2014    Priority: Medium  . Essential hypertension 10/19/2008    Priority: Medium  . Hyperlipidemia 01/25/2007    Priority: Medium  . Polyp of colon, adenomatous 02/06/2015    Priority: Low  . Obesity, Class II, BMI 35-39.9 04/19/2010    Priority: Low  . OSA on CPAP 07/11/2008    Priority: Low    Medications- reviewed and updated Current Outpatient Prescriptions  Medication Sig Dispense Refill  . fluticasone (FLONASE) 50 MCG/ACT nasal spray Place 2 sprays into both nostrils daily. 16 g 6  . Multiple Vitamin (MULTIVITAMIN) capsule Take 1 capsule by mouth daily.      Marland Kitchen olmesartan-hydrochlorothiazide (BENICAR HCT) 20-12.5 MG tablet TAKE 1 TABLET BY MOUTH DAILY. 90 tablet 3  . Omega-3 Fatty Acids (FISH OIL) 1000 MG CAPS Take by mouth.       No current facility-administered medications for this visit.     Objective: BP 108/70 (BP Location: Left Arm, Patient Position: Sitting, Cuff Size: Large)   Pulse (!) 59   Temp 98.1 F (36.7 C) (Oral)   Ht 5' 5.75" (1.67 m)   Wt 236 lb 3.2 oz (107.1 kg)   SpO2 96%   BMI 38.41 kg/m  Gen: NAD, resting comfortably CV: RRR no murmurs rubs or gallops Lungs: CTAB no crackles, wheeze, rhonchi Abdomen: soft/nontender/nondistended/normal bowel sounds. obese Ext: trace edema Skin: warm, dry, no rash  Assessment/Plan:  Essential hypertension S: controlled on benicar HCTZ 20-12.5mg  .  Sometimes will use half dose if BP lower on home monitoring BP Readings from Last 3 Encounters:  07/31/16 108/70  01/01/16 110/64  07/25/15 104/68  A/P:Continue current medications- excellent control  Hyperlipidemia S: mild poorly controlled on no  statin.  Lab Results  Component Value Date   CHOL 206 (H) 12/27/2015   HDL 36.30 (L) 12/27/2015   LDLCALC 134 (H) 12/27/2015   LDLDIRECT 159.1 01/04/2010   TRIG 178.0 (H) 12/27/2015   CHOLHDL 6 12/27/2015   A/P: 9.5% 10 year risk at last visit- patient has wanted to use a 12% cut off or higher *she is rather firmly opposed to statin use). Also some hyperglycemia so weight loss preferred- unfortunately has not achieved much weight loss  Hyperglycemia S: Hyperglycemia with sugar at 107 on last check.  A/P: Her motivation seems to be moderate to make changes- she was able to set the following goals- "1. Decrease portion size 2. Concrete goal of 3x a week for half an hour- starting on treadclimber (as long as no chest or shortness of breath) 3. Walking more with better weather "  Likely update a1c at that time- can discuss metformin option as well Return in about 6 months (around 01/28/2017) for physical.  Return precautions advised.  Garret Reddish, MD

## 2016-07-31 NOTE — Patient Instructions (Addendum)
1. Decrease portion size 2. Concrete goal of 3x a week for half an hour- starting on treadclimber (as long as no chest or shortness of breath) 3. Walking more with better weather  Next visit bring a copy of bone density   Starting October 1st 2018, I will be transferring to our new location: [facility] (corner of [address] [employer]) [place], [place] Phone: [phone]  I would love to have you remain my patient at this new location as long as it remains convenient for you. I am excited about the opportunity to have x-ray and sports medicine in the new building but will really miss the awesome staff and physicians at [facility]. Continue to schedule appointments at [facility] and we will automatically transfer them to the [place] location starting October 1st.

## 2016-08-05 ENCOUNTER — Ambulatory Visit
Admission: RE | Admit: 2016-08-05 | Discharge: 2016-08-05 | Disposition: A | Discharge status: Home / Self Care | Payer: BLUE CROSS/BLUE SHIELD | Source: Ambulatory Visit | Attending: Family Medicine | Admitting: Family Medicine

## 2016-08-05 DIAGNOSIS — Z1231 Encounter for screening mammogram for malignant neoplasm of breast: Secondary | ICD-10-CM

## 2016-12-02 ENCOUNTER — Other Ambulatory Visit: Payer: Self-pay | Admitting: Family Medicine

## 2016-12-04 DIAGNOSIS — H02401 Unspecified ptosis of right eyelid: Secondary | ICD-10-CM | POA: Diagnosis not present

## 2016-12-04 DIAGNOSIS — D4981 Neoplasm of unspecified behavior of retina and choroid: Secondary | ICD-10-CM | POA: Diagnosis not present

## 2016-12-04 DIAGNOSIS — H04123 Dry eye syndrome of bilateral lacrimal glands: Secondary | ICD-10-CM | POA: Diagnosis not present

## 2016-12-29 ENCOUNTER — Other Ambulatory Visit: Payer: Managed Care, Other (non HMO)

## 2017-01-05 ENCOUNTER — Encounter: Payer: Managed Care, Other (non HMO) | Admitting: Family Medicine

## 2017-03-04 ENCOUNTER — Ambulatory Visit (INDEPENDENT_AMBULATORY_CARE_PROVIDER_SITE_OTHER): Payer: BLUE CROSS/BLUE SHIELD | Admitting: Family Medicine

## 2017-03-04 ENCOUNTER — Encounter: Payer: Self-pay | Admitting: Family Medicine

## 2017-03-04 VITALS — BP 118/78 | HR 52 | Temp 97.9°F | Ht 67.0 in | Wt 230.4 lb

## 2017-03-04 DIAGNOSIS — Z0000 Encounter for general adult medical examination without abnormal findings: Secondary | ICD-10-CM

## 2017-03-04 DIAGNOSIS — E785 Hyperlipidemia, unspecified: Secondary | ICD-10-CM | POA: Diagnosis not present

## 2017-03-04 DIAGNOSIS — I10 Essential (primary) hypertension: Secondary | ICD-10-CM

## 2017-03-04 DIAGNOSIS — M8588 Other specified disorders of bone density and structure, other site: Secondary | ICD-10-CM

## 2017-03-04 DIAGNOSIS — R739 Hyperglycemia, unspecified: Secondary | ICD-10-CM

## 2017-03-04 DIAGNOSIS — M810 Age-related osteoporosis without current pathological fracture: Secondary | ICD-10-CM | POA: Insufficient documentation

## 2017-03-04 LAB — CBC
HCT: 39.4 % (ref 36.0–46.0)
Hemoglobin: 13.3 g/dL (ref 12.0–15.0)
MCHC: 33.9 g/dL (ref 30.0–36.0)
MCV: 92.8 fl (ref 78.0–100.0)
Platelets: 254 10*3/uL (ref 150.0–400.0)
RBC: 4.24 Mil/uL (ref 3.87–5.11)
RDW: 13.7 % (ref 11.5–15.5)
WBC: 5.6 10*3/uL (ref 4.0–10.5)

## 2017-03-04 LAB — COMPREHENSIVE METABOLIC PANEL
ALT: 32 U/L (ref 0–35)
AST: 30 U/L (ref 0–37)
Albumin: 4.4 g/dL (ref 3.5–5.2)
Alkaline Phosphatase: 43 U/L (ref 39–117)
BUN: 19 mg/dL (ref 6–23)
CALCIUM: 10 mg/dL (ref 8.4–10.5)
CHLORIDE: 102 meq/L (ref 96–112)
CO2: 30 meq/L (ref 19–32)
Creatinine, Ser: 0.9 mg/dL (ref 0.40–1.20)
GFR: 65.59 mL/min (ref >60.00)
Glucose, Bld: 97 mg/dL (ref 70–99)
Potassium: 3.8 mEq/L (ref 3.5–5.1)
SODIUM: 140 meq/L (ref 135–145)
Total Bilirubin: 0.6 mg/dL (ref 0.2–1.2)
Total Protein: 7.4 g/dL (ref 6.0–8.3)

## 2017-03-04 LAB — LIPID PANEL
CHOL/HDL RATIO: 5
Cholesterol: 200 mg/dL (ref 0–200)
HDL: 43.6 mg/dL (ref >39.00)
LDL CALC: 121 mg/dL — AB (ref 0–99)
NonHDL: 156
TRIGLYCERIDES: 173 mg/dL — AB (ref 0.0–149.0)
VLDL: 34.6 mg/dL (ref 0.0–40.0)

## 2017-03-04 LAB — HEMOGLOBIN A1C: Hgb A1c MFr Bld: 6.2 % (ref 4.6–6.5)

## 2017-03-04 NOTE — Assessment & Plan Note (Signed)
HTN- controlled on benicar HCT 20-12.5 mg. Some lightheadedness at times- no falls- will have to watch it. She is going to try half tablet of benicar HCT as long as home pressures < 140/90 due to lightheaded episodes

## 2017-03-04 NOTE — Assessment & Plan Note (Signed)
Hyperglycemia- update cbg and a1c Lab Results  Component Value Date   HGBA1C 6.1 01/23/2015

## 2017-03-04 NOTE — Patient Instructions (Addendum)
She is going to try half tablet of benicar HCT as long as home pressures < 140/90 due to lightheaded episodes  Please send [place] a mychart message when you get your flu shot  Please stop by lab before you go

## 2017-03-04 NOTE — Assessment & Plan Note (Signed)
8. Osteoporosis screening- had done at [facility] center 03/09/15 . Shows T score -1.1 in lumbar spine. Normal both hips. Discussed repeat 2019 or 2020. Did advise regular walking, already takes MV with vitamin D

## 2017-03-04 NOTE — Addendum Note (Signed)
Addended by: Tomi Likens on: 03/04/2017 09:30 AM   Modules accepted: Orders

## 2017-03-04 NOTE — Assessment & Plan Note (Signed)
HLD- has wanted to use 12% cut off for starting statin and has not crossed 10% threshold yet- for primary prevention. was taking fish oil- now doing juice plus. Update lipids today.

## 2017-03-04 NOTE — Progress Notes (Addendum)
Phone: [phone]  Subjective:  Patient presents today for their annual physical. Chief complaint-noted.   See problem oriented charting- ROS- full  review of systems was completed and negative including No chest pain or shortness of breath. No headache or blurry vision.   The following were reviewed and entered/updated in epic: Past Medical History:  Diagnosis Date  . Hyperlipidemia   . Hypertension   . Sleep apnea    uses cpap  . TRANSAMINASES, SERUM, ELEVATED 10/19/2008   Mild elevations - resolved     Patient Active Problem List   Diagnosis Date Noted  . Hyperglycemia 12/01/2014    Priority: Medium  . Essential hypertension 10/19/2008    Priority: Medium  . Hyperlipidemia 01/25/2007    Priority: Medium  . Polyp of colon, adenomatous 02/06/2015    Priority: Low  . Obesity, Class II, BMI 35-39.9 04/19/2010    Priority: Low  . OSA on CPAP 07/11/2008    Priority: Low  . Osteopenia 03/04/2017   Past Surgical History:  Procedure Laterality Date  . ABDOMINAL HYSTERECTOMY     severe bleeding, still has ovaries  . cataract surgery     bilateral  . FLEXIBLE SIGMOIDOSCOPY  2001    Family History  Problem Relation Age of Onset  . Stroke Mother   . Stroke Father   . Hypertension Other   . Lung cancer Brother        x2 smokers  . Scleroderma Brother        "died from thrombosis"  . Other Sister        spinal muscular atrophy- 1 sister, 2 brothers  . Colon cancer Neg Hx     Medications- reviewed and updated Current Outpatient Prescriptions  Medication Sig Dispense Refill  . fluticasone (FLONASE) 50 MCG/ACT nasal spray Place 2 sprays into both nostrils daily. 16 g 6  . Multiple Vitamin (MULTIVITAMIN) capsule Take 1 capsule by mouth daily.      Marland Kitchen olmesartan-hydrochlorothiazide (BENICAR HCT) 20-12.5 MG tablet TAKE 1 TABLET BY MOUTH DAILY. 90 tablet 3  . Omega-3 Fatty Acids (FISH OIL) 1000 MG CAPS Take by mouth.       No current facility-administered medications  for this visit.     Allergies-reviewed and updated Allergies  Allergen Reactions  . Tetanus-Diphtheria Toxoids Td     Severe rash, fever, hospitalized.   . Codeine Sulfate     REACTION: unspecified    Social History   Social History  . Marital status: Single    Spouse name: N/A  . Number of children: N/A  . Years of education: N/A   Social History Main Topics  . Smoking status: Never Smoker  . Smokeless tobacco: Never Used  . Alcohol use No  . Drug use: No  . Sexual activity: Not Asked   Other Topics Concern  . None   Social History Narrative   Married (49 years in 2016[facility] patient of Dr. Yong Channel). 3 children. 5 grandkids.       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 Oropharynx normal Neck: no thyromegaly CV: RRR no murmurs rubs or gallops Lungs: CTAB  no crackles, wheeze, rhonchi Abdomen: soft/nontender/nondistended/normal bowel sounds. No rebound or guarding.  Ext: no edema Skin: warm, dry Neuro: grossly normal, moves all extremities, PERRLA  Assessment/Plan:  71 y.o. female presenting for annual physical.  Health Maintenance counseling: 1. Anticipatory guidance: Patient counseled regarding regular dental exams q15months, eye exams -q6 months then back to yearly after cataracts, wearing seatbelts.  2. Risk factor reduction:  Advised patient of need for regular exercise and diet rich and fruits and vegetables to reduce risk of heart attack and stroke. Exercise- no set plan on exercise- we discussed starting this. Diet-cut down on carbs/sweets.  Down 6 lbs over last 7 months- has cut down on salt. Wants to lose 5-6 lbs per year.  Wt Readings from Last 3 Encounters:    03/04/17 230 lb 6.4 oz (104.5 kg)  07/31/16 236 lb 3.2 oz (107.1 kg)  01/01/16 234 lb 6.4 oz (106.3 kg)  3. Immunizations/screenings/ancillary studies- advised flu shot today -gets at work. Discussed shingrix availability issues . Td allergy Immunization History  Administered Date(s) Administered  . Influenza-Unspecified 03/16/2014  . Pneumococcal Conjugate-13 12/01/2014  . Pneumococcal Polysaccharide-23 12/17/2011  . Zoster 12/17/2011  4. Cervical cancer screening- passed age based screening. No history abnormal pap. Still gets with gynecology 5. Breast cancer screening-  breast exam today largely normal- left lower breast with one spot very mildly tender - she is to monitor this spot and if becomes more tender or if notes any growth in area would consider ultrasound-  and mammogram 08/05/16 6. Colon cancer screening - 01/2015 with 5 year follow up due to adenoma 7. Skin cancer screening- has dermatologist. advised regular sunscreen use. Denies worrisome, changing, or new skin lesions.  8. Osteoporosis screening- had done at breast center 03/09/15 . Shows T score -1.1 in lumbar spine. Normal both hips. Discussed repeat 2019 or 2020  Status of chronic or acute concerns   OSA on Cpap  Osteopenia 8. Osteoporosis screening- had done at breast center 03/09/15 . Shows T score -1.1 in lumbar spine. Normal both hips. Discussed repeat 2019 or 2020. Did advise regular walking, already takes MV with vitamin D  Essential hypertension HTN- controlled on benicar HCT 20-12.5 mg. Some lightheadedness at times- no falls- will have to watch it. She is going to try half tablet of benicar HCT as long as home pressures < 140/90 due to lightheaded episodes  Hyperlipidemia HLD- has wanted to use 12% cut off for starting statin and has not crossed 10% threshold yet- for primary prevention. was taking fish oil- now doing juice plus. Update lipids today.   Hyperglycemia Hyperglycemia- update cbg and a1c Lab  Results  Component Value Date   HGBA1C 6.1 01/23/2015    1 year physical  Orders Placed This Encounter  Procedures  . HM DEXA SCAN    This external order was created through the Results Console.  . CBC    Standing Status:   Future    Standing Expiration Date:   03/04/2018  . Comprehensive metabolic panel    [place]    Standing Status:   Future    Standing Expiration Date:   03/04/2018  . Lipid panel    Standing Status:   Future    Standing Expiration Date:   03/04/2018  . Hemoglobin A1c    [place]    Standing Status:   Future    Standing Expiration Date:   03/04/2018   Return precautions advised.  Garret Reddish, MD

## 2017-03-10 ENCOUNTER — Encounter: Payer: Self-pay | Admitting: Family Medicine

## 2017-04-06 ENCOUNTER — Encounter: Payer: Self-pay | Admitting: Family Medicine

## 2017-04-07 MED ORDER — FLUTICASONE PROPIONATE 50 MCG/ACT NA SUSP: 2.0000 | Freq: Every day | NASAL | 3 refills | Status: DC

## 2017-05-19 DIAGNOSIS — M9908 Segmental and somatic dysfunction of rib cage: Secondary | ICD-10-CM | POA: Diagnosis not present

## 2017-05-19 DIAGNOSIS — M9902 Segmental and somatic dysfunction of thoracic region: Secondary | ICD-10-CM | POA: Diagnosis not present

## 2017-05-19 DIAGNOSIS — M9901 Segmental and somatic dysfunction of cervical region: Secondary | ICD-10-CM | POA: Diagnosis not present

## 2017-05-28 DIAGNOSIS — M9901 Segmental and somatic dysfunction of cervical region: Secondary | ICD-10-CM | POA: Diagnosis not present

## 2017-05-28 DIAGNOSIS — M9908 Segmental and somatic dysfunction of rib cage: Secondary | ICD-10-CM | POA: Diagnosis not present

## 2017-05-28 DIAGNOSIS — M9902 Segmental and somatic dysfunction of thoracic region: Secondary | ICD-10-CM | POA: Diagnosis not present

## 2017-06-18 ENCOUNTER — Ambulatory Visit (INDEPENDENT_AMBULATORY_CARE_PROVIDER_SITE_OTHER): Payer: BLUE CROSS/BLUE SHIELD

## 2017-06-18 ENCOUNTER — Encounter: Payer: Self-pay | Admitting: Sports Medicine

## 2017-06-18 ENCOUNTER — Ambulatory Visit: Payer: BLUE CROSS/BLUE SHIELD | Admitting: Sports Medicine

## 2017-06-18 VITALS — BP 110/80 | HR 63 | Ht 67.0 in | Wt 229.4 lb

## 2017-06-18 DIAGNOSIS — R0781 Pleurodynia: Secondary | ICD-10-CM | POA: Diagnosis not present

## 2017-06-18 DIAGNOSIS — M8588 Other specified disorders of bone density and structure, other site: Secondary | ICD-10-CM

## 2017-06-18 DIAGNOSIS — S62101A Fracture of unspecified carpal bone, right wrist, initial encounter for closed fracture: Secondary | ICD-10-CM | POA: Diagnosis not present

## 2017-06-18 DIAGNOSIS — S6991XA Unspecified injury of right wrist, hand and finger(s), initial encounter: Secondary | ICD-10-CM | POA: Diagnosis not present

## 2017-06-18 DIAGNOSIS — M25531 Pain in right wrist: Secondary | ICD-10-CM | POA: Diagnosis not present

## 2017-06-18 DIAGNOSIS — S2231XA Fracture of one rib, right side, initial encounter for closed fracture: Secondary | ICD-10-CM | POA: Diagnosis not present

## 2017-06-18 MED ORDER — NAPROXEN 375 MG PO TABS: 375.0000 mg | ORAL_TABLET | Freq: Two times a day (BID) | ORAL | 1 refills | Status: DC

## 2017-06-18 MED ORDER — TRAMADOL HCL 50 MG PO TABS: 50.0000 mg | ORAL_TABLET | Freq: Four times a day (QID) | ORAL | 0 refills | Status: DC | PRN

## 2017-06-18 NOTE — Progress Notes (Signed)
Erica Burke. Erica Burke, [place] [facility] [place]  Erica Burke - 72 y.o. female MRN [id number]  Date of birth: [birth year]  Visit Date: 06/18/2017  PCP: Marin Olp, MD   Referred by: Marin Olp, MD   Scribe for today's visit: Josepha Pigg, CMA     SUBJECTIVE:  Erica Burke is here for New Patient (Initial Visit) (RT wrist pain )  Her RT wrist pain symptoms INITIALLY: Began this morning and were causes by a fall in the shower.  Described as severe throbbing and swelling, non-radiating Worsened with holding hand down, pressure Improved with rest, compressoin Additional associated symptoms include: Pt also c/o RT sided mid-back pain. She denies lower back pain, neck pain, loss of consciousness.     At this time symptoms show no change compared to onset  She took Ibuprofen 600 mg around 8:30 AM.    ROS  Denies fevers, chills, or night sweats. Denies unexplained weight loss. Denies personal history of cancer. Denies changes in bowel or bladder habits. Reports recent unreported falls. Denies new or worsening dyspnea or wheezing. Denies headaches or dizziness.  Denies numbness, tingling or weakness  In the extremities.  Denies dizziness or presyncopal episodes Denies lower extremity edema     HISTORY & PERTINENT PRIOR DATA:  Prior History reviewed and updated per electronic medical record.  Significant history, findings, studies and interim changes include:  reports that  has never smoked. she has never used smokeless tobacco. Recent Labs    03/04/17 0930  HGBA1C 6.2   No specialty comments available. Problem  Closed Fracture of One Rib of Right Side  Closed Fracture of Right Wrist  Osteopenia    OBJECTIVE:  VS:  HT:5\' 7"  (170.2 cm)   WT:229 lb 6.4 oz (104.1 kg)  BMI:35.92    BP:110/80  HR:63bpm  TEMP: ( )  RESP:96 %   PHYSICAL EXAM: Constitutional: WDWN, Non-toxic appearing. Psychiatric: Alert &  appropriately interactive. Not depressed or anxious appearing. Respiratory: No increased work of breathing. Trachea Midline Eyes: Pupils are equal. EOM intact without nystagmus. No scleral icterus Cardiovascular:  Peripheral Pulses: peripheral pulses symmetrical No clubbing or cyanosis appreciated Capillary Refill is normal, less than 2 seconds No signficant generalized edema/anasarca Sensory Exam: intact to light touch  Right wrist: Overall well aligned with generalized swelling across the midportion of the wrist pain with flexion extension, ulnar deviation and radial deviation.  Ulnar deviation is worse.  Focal TTP along the ulnar side of the distal radius at the DRUJ.  Grip strength is intact.  Sensation is normal.  Good capillary refill.  Back and chest wall: Focal TTP over the inferior ribs along the mid axillary line.  There is a focal area of tenderness along anterior axillary fold at rib 10.  Findings:  X-rays reviewed, atypical pattern of the distal radius but consistent with acute fracture.    ASSESSMENT & PLAN:   1. Closed fracture of right wrist, initial encounter   2. Right wrist pain   3. Rib pain on right side   4. Osteopenia of lumbar spine   5. Closed fracture of one rib of right side, initial encounter    PLAN:    Closed fracture of right wrist Nondisplaced atypical fracture pattern likely secondary to ulnar deviated load as opposed to a true fall on outstretched hand.  Wrist immobilization recommended and avoidance of lifting. Symptom control of her meds.  Rest and elevation.  We will  plan to check on her progress in 2 weeks and see how she is doing.  Recommend around-the-clock immobilization with well fitted wrist brace that was provided today.  Discussed the possibility of worsening symptoms with appropriate  in spite of appropriate treatment.  Closed fracture of one rib of right side Single level of rib fracture.  Mildly symptomatic.  Symptomatic  treatment.  Osteopenia Documented osteopenia lumbar spine.  No recent DEXA. Can consider anti-reabsorptive after acute healing phase.     ++++++++++++++++++++++++++++++++++++++++++++ Orders & Meds: Orders Placed This Encounter  Procedures  . DG Wrist Complete Right  . DG Ribs Unilateral W/Chest Right    Meds ordered this encounter  Medications  . naproxen (NAPROSYN) 375 MG tablet    Sig: Take 1 tablet (375 mg total) by mouth 2 (two) times daily with a meal.    Dispense:  60 tablet    Refill:  1  . traMADol (ULTRAM) 50 MG tablet    Sig: Take 1 tablet (50 mg total) by mouth every 6 (six) hours as needed for moderate pain.    Dispense:  30 tablet    Refill:  0    ++++++++++++++++++++++++++++++++++++++++++++ Follow-up: Return in about 2 weeks (around 07/02/2017).   Pertinent documentation may be included in additional procedure notes, imaging studies, problem based documentation and patient instructions. Please see these sections of the encounter for additional information regarding this visit. CMA/ATC served as Education administrator during this visit. History, Physical, and Plan performed by medical provider. Documentation and orders reviewed and attested to.      Gerda Diss, [facility] Physician

## 2017-06-24 DIAGNOSIS — S62101A Fracture of unspecified carpal bone, right wrist, initial encounter for closed fracture: Secondary | ICD-10-CM | POA: Insufficient documentation

## 2017-06-24 DIAGNOSIS — S2231XA Fracture of one rib, right side, initial encounter for closed fracture: Secondary | ICD-10-CM | POA: Insufficient documentation

## 2017-06-24 NOTE — Assessment & Plan Note (Addendum)
Documented osteopenia lumbar spine.  No recent DEXA. Can consider anti-reabsorptive after acute healing phase.

## 2017-06-24 NOTE — Assessment & Plan Note (Signed)
Single level of rib fracture.  Mildly symptomatic.  Symptomatic treatment.

## 2017-06-24 NOTE — Assessment & Plan Note (Signed)
Nondisplaced atypical fracture pattern likely secondary to ulnar deviated load as opposed to a true fall on outstretched hand.  Wrist immobilization recommended and avoidance of lifting. Symptom control of her meds.  Rest and elevation.  We will plan to check on her progress in 2 weeks and see how she is doing.  Recommend around-the-clock immobilization with well fitted wrist brace that was provided today.  Discussed the possibility of worsening symptoms with appropriate  in spite of appropriate treatment.

## 2017-07-02 ENCOUNTER — Ambulatory Visit (INDEPENDENT_AMBULATORY_CARE_PROVIDER_SITE_OTHER): Payer: BLUE CROSS/BLUE SHIELD

## 2017-07-02 ENCOUNTER — Encounter: Payer: Self-pay | Admitting: Sports Medicine

## 2017-07-02 ENCOUNTER — Ambulatory Visit: Payer: BLUE CROSS/BLUE SHIELD | Admitting: Sports Medicine

## 2017-07-02 VITALS — BP 110/82 | HR 53 | Ht 67.0 in | Wt 229.0 lb

## 2017-07-02 DIAGNOSIS — S52514A Nondisplaced fracture of right radial styloid process, initial encounter for closed fracture: Secondary | ICD-10-CM | POA: Diagnosis not present

## 2017-07-02 DIAGNOSIS — M8000XS Age-related osteoporosis with current pathological fracture, unspecified site, sequela: Secondary | ICD-10-CM | POA: Diagnosis not present

## 2017-07-02 DIAGNOSIS — S2231XA Fracture of one rib, right side, initial encounter for closed fracture: Secondary | ICD-10-CM | POA: Diagnosis not present

## 2017-07-02 DIAGNOSIS — S62101A Fracture of unspecified carpal bone, right wrist, initial encounter for closed fracture: Secondary | ICD-10-CM

## 2017-07-02 NOTE — Assessment & Plan Note (Signed)
Will need to follow-up after acute healing phase.  Probably need to initiate antiresorptive therapy.

## 2017-07-02 NOTE — Progress Notes (Signed)
Erica Burke. Erica Burke, Erica Burke at [place]  Erica Burke - 72 y.o. female MRN [id number]  Date of birth: [birth year]  Visit Date: 07/02/2017  PCP: Marin Olp, MD   Referred by: Marin Olp, MD   Scribe for today's visit: Erica Burke, CMA     SUBJECTIVE:  Erica Burke is here for Follow-up (RT wrist fracture)  Compared to the last office visit, her previously described symptoms show no change  Current symptoms are mild-severe depending on activity level & are nonradiating She has been taking Naproxen prn and Tramadol at bedtime prn. She has also been wearing wrist brace.   Rib pain has improved.   ROS Denies night time disturbances. Denies fevers, chills, or night sweats. Denies unexplained weight loss. Denies personal history of cancer. Denies changes in bowel or bladder habits. Denies recent unreported falls. Denies new or worsening dyspnea or wheezing. Denies headaches or dizziness.  Denies numbness, tingling or weakness  In the extremities.  Denies dizziness or presyncopal episodes Denies lower extremity edema     HISTORY & PERTINENT PRIOR DATA:  Prior History reviewed and updated per electronic medical record.  Significant history, findings, studies and interim changes include:  reports that  has never smoked. she has never used smokeless tobacco. Recent Labs    03/04/17 0930  HGBA1C 6.2   No specialty comments available. Problem  Closed Fracture of Right Wrist  Osteoporosis   Fragility fracture of right wrist.  Candidate for antiresorptive therapy.     OBJECTIVE:  VS:  HT:5\' 7"  (170.2 cm)   WT:229 lb (103.9 kg)  BMI:35.86    BP:110/82  HR:(!) 53bpm  TEMP: ( )  RESP:97 %   PHYSICAL EXAM: Constitutional: WDWN, Non-toxic appearing. Psychiatric: Alert & appropriately interactive.  Not depressed or anxious appearing. Respiratory: No increased work of breathing.  Trachea Midline Eyes:  Pupils are equal.  EOM intact without nystagmus.  No scleral icterus  EXTREMITIES EXAM: No clubbing or cyanosis appreciated Capillary Refill is normal, less than 2 seconds No significant venous stasis changes Pre-tibial edema: No significant pretibial edema Pedal Pulses: Normal & symmetrically palpable  Sensation: Intact to light touch in all UE dermatomes Strength: Normal in bilateral UE myotomes  Right wrist: Overall well aligned minimal bruising.  She has pain over the distal radius but this is mild.  Minimal flexion and extension at this time but no significant pain with arc that is present.  Grip strength is intact.  Right ribs are minimally tender to palpation directly over the fracture site but this is mild.  No increased work of breathing.  No additional findings.   ASSESSMENT & PLAN:   1. Closed fracture of right wrist, initial encounter   2. Closed fracture of one rib of right side, initial encounter   3. Osteoporosis with current pathological fracture, unspecified osteoporosis type, sequela    PLAN:    Osteoporosis Will need to follow-up after acute healing phase.  Probably need to initiate antiresorptive therapy.  Closed fracture of right wrist X-rays do show small amount of lateral movement of the distal fracture fragment.  Should continue to do well with continued immobilization but recommend avoidance of any exacerbating activities.  Follow-up in 2 weeks for repeat x-rays at that time.   ++++++++++++++++++++++++++++++++++++++++++++ Orders & Meds: Orders Placed This Encounter  Procedures  . DG Wrist 2 Views Right    No orders of the defined types were placed in this encounter.   ++++++++++++++++++++++++++++++++++++++++++++  Follow-up: Return in about 2 weeks (around 07/16/2017) for repeat X-rays.   Pertinent documentation may be included in additional procedure notes, imaging studies, problem based documentation and patient instructions. Please see these  sections of the encounter for additional information regarding this visit. CMA/ATC served as Education administrator during this visit. History, Physical, and Plan performed by medical provider. Documentation and orders reviewed and attested to.      Gerda Diss, [facility] Physician

## 2017-07-02 NOTE — Assessment & Plan Note (Signed)
X-rays do show small amount of lateral movement of the distal fracture fragment.  Should continue to do well with continued immobilization but recommend avoidance of any exacerbating activities.  Follow-up in 2 weeks for repeat x-rays at that time.

## 2017-07-08 ENCOUNTER — Encounter: Payer: Self-pay | Admitting: Sports Medicine

## 2017-07-08 ENCOUNTER — Ambulatory Visit: Payer: BLUE CROSS/BLUE SHIELD | Admitting: Sports Medicine

## 2017-07-08 VITALS — BP 110/72 | HR 76 | Ht 67.0 in

## 2017-07-08 DIAGNOSIS — S62101D Fracture of unspecified carpal bone, right wrist, subsequent encounter for fracture with routine healing: Secondary | ICD-10-CM

## 2017-07-08 DIAGNOSIS — S2231XA Fracture of one rib, right side, initial encounter for closed fracture: Secondary | ICD-10-CM | POA: Diagnosis not present

## 2017-07-08 NOTE — Progress Notes (Addendum)
  Erica Burke. Erica Burke, [place] at [place]  Erica Burke - 72 y.o. female MRN [id number]  Date of birth: 23-Apr-1946  Visit Date: 07/08/2017  PCP: Marin Olp, MD   Referred by: Marin Olp, MD   Scribe for today's visit: Josepha Pigg, CMA     SUBJECTIVE:  Erica Burke is here for Follow-up (RT wrist fracture)  Compared to the last office visit, her previously described symptoms of RT wrist pain are: are worsening, she is sore along the lateral aspect of the arm.  Current symptoms are moderate-severe & are radiating to the thumb and lateral aspect of the arm.  She has been wearing wrist brace and has been experiencing some increased pain. She wears the brace all day expect for in the shower.    ROS Reports night time disturbances. Denies fevers, chills, or night sweats. Denies unexplained weight loss. Denies personal history of cancer. Denies changes in bowel or bladder habits. Denies recent unreported falls. Denies new or worsening dyspnea or wheezing. Denies headaches or dizziness.  Denies numbness, tingling or weakness  In the extremities.  Denies dizziness or presyncopal episodes Denies lower extremity edema     HISTORY & PERTINENT PRIOR DATA:  Prior History reviewed and updated per electronic medical record.  Significant history, findings, studies and interim changes include:  reports that  has never smoked. she has never used smokeless tobacco. Recent Labs    03/04/17 0930  HGBA1C 6.2   No specialty comments available. No problems updated.  OBJECTIVE:  VS:  HT:5\' 7"  (170.2 cm)   WT:   BMI:     BP:110/72  HR:76bpm  TEMP: ( )  RESP:95 %   PHYSICAL EXAM: Constitutional: WDWN, Non-toxic appearing. Psychiatric: Alert & appropriately interactive.  Not depressed or anxious appearing. Respiratory: No increased work of breathing.  Trachea Midline Eyes: Pupils are equal.  EOM intact without  nystagmus.  No scleral icterus  NEUROVASCULAR exam: No clubbing or cyanosis appreciated No significant venous stasis changes Capillary Refill: normal, less than 2 seconds   Wrist is overall well aligned.  She has focal tenderness over the distal radius and DRUJ.  Range of motion is actually well preserved given the acute fracture but still having expected discomfort and limitation in range of motion. Grip strength is intact  No additional findings.   ASSESSMENT & PLAN:   1. Closed fracture of right wrist with routine healing, subsequent encounter   2. Closed fracture of one rib of right side, initial encounter    PLAN: Custom EXOS fracture brace fabricated today.  Follow-up in 2 weeks for repeat x-rays and repeat clinical exam.  Avoid any significant use of the hand.  No problem-specific Assessment & Plan notes found for this encounter.    ++++++++++++++++++++++++++++++++++++++++++++ Follow-up: Return in about 2 weeks (around 07/22/2017) for repeat X-rays.   Pertinent documentation may be included in additional procedure notes, imaging studies, problem based documentation and patient instructions. Please see these sections of the encounter for additional information regarding this visit. CMA/ATC served as Education administrator during this visit. History, Physical, and Plan performed by medical provider. Documentation and orders reviewed and attested to.      Gerda Diss, [place] [facility] Physician

## 2017-07-16 ENCOUNTER — Ambulatory Visit: Payer: BLUE CROSS/BLUE SHIELD | Admitting: Sports Medicine

## 2017-07-20 ENCOUNTER — Encounter: Payer: Self-pay | Admitting: Sports Medicine

## 2017-07-20 ENCOUNTER — Ambulatory Visit (INDEPENDENT_AMBULATORY_CARE_PROVIDER_SITE_OTHER): Payer: BLUE CROSS/BLUE SHIELD

## 2017-07-20 ENCOUNTER — Ambulatory Visit: Payer: BLUE CROSS/BLUE SHIELD | Admitting: Sports Medicine

## 2017-07-20 VITALS — BP 114/78 | HR 68 | Ht 67.0 in | Wt 229.6 lb

## 2017-07-20 DIAGNOSIS — S62101D Fracture of unspecified carpal bone, right wrist, subsequent encounter for fracture with routine healing: Secondary | ICD-10-CM

## 2017-07-20 DIAGNOSIS — S62101A Fracture of unspecified carpal bone, right wrist, initial encounter for closed fracture: Secondary | ICD-10-CM | POA: Diagnosis not present

## 2017-07-20 DIAGNOSIS — S6291XD Unspecified fracture of right wrist and hand, subsequent encounter for fracture with routine healing: Secondary | ICD-10-CM | POA: Diagnosis not present

## 2017-07-20 NOTE — Progress Notes (Signed)
  Erica Burke. Rig[place], [place] at [facility] [id number]  Erica Burke - 72 y.o. female MRN [id number]  Date of birth: [birth year]  Visit Date: 07/20/2017  PCP: Erica Olp, MD   Referred by: Erica Olp, MD   Scribe for today's visit: Wendy Poet, LAT, ATC     SUBJECTIVE:  Erica Burke is here for Follow-up (r wrist fracture) .   Compared to the last office visit on 07/08/17, her previously described R wrist pain symptoms are improving w/ less overall pain noted.  Con't to wear her brace constantly except for bathing.  Notes some discomfort along the the base of thumb while in the brace. Current symptoms are mile-mod & are nonradiating She has been wearing her Exos splint.   ROS Denies night time disturbances. Denies fevers, chills, or night sweats. Denies unexplained weight loss. Denies personal history of cancer. Denies changes in bowel or bladder habits. Denies recent unreported falls. Denies new or worsening dyspnea or wheezing. Denies headaches or dizziness.  Denies numbness, tingling or weakness  In the extremities.  Denies dizziness or presyncopal episodes Denies lower extremity edema     HISTORY & PERTINENT PRIOR DATA:  Prior History reviewed and updated per electronic medical record.  Significant history, findings, studies and interim changes include:  reports that  has never smoked. she has never used smokeless tobacco. Recent Labs    03/04/17 0930  HGBA1C 6.2   No specialty comments available. No problems updated.  OBJECTIVE:  VS:  HT:5\' 7"  (170.2 cm)   WT:229 lb 9.6 oz (104.1 kg)  BMI:35.95    BP:114/78  HR:68bpm  TEMP: ( )  RESP:96 %   PHYSICAL EXAM: Constitutional: WDWN, Non-toxic appearing. Psychiatric: Alert & appropriately interactive.  Not depressed or anxious appearing. Respiratory: No increased work of breathing.  Trachea Midline Eyes: Pupils are equal.  EOM intact without nystagmus.  No  scleral icterus  NEUROVASCULAR exam: No clubbing or cyanosis appreciated No significant venous stasis changes Capillary Refill: normal, less than 2 seconds   Right wrist: Overall well aligned.  She has only minimal pain with palpation of the distal radial styloid.  Pain is worse with wrist extension however this is not pushed during the exam.  Ulcers and sensation are intact.   ASSESSMENT & PLAN:   1. Closed fracture of right wrist with routine healing, subsequent encounter    ++++++++++++++++++++++++++++++++++++++++++++ Orders & Meds:  Orders Placed This Encounter  Procedures  . DG Wrist 2 Views Right   No orders of the defined types were placed in this encounter.   ++++++++++++++++++++++++++++++++++++++++++++ PLAN: This is seemingly doing quite well.  We will plan to have her continue with the immobilization and follow-up in 2 additional weeks and hope to transition her out of it at that time.   Follow-up: Return in about 2 weeks (around 08/03/2017).   Pertinent documentation may be included in additional procedure notes, imaging studies, problem based documentation and patient instructions. Please see these sections of the encounter for additional information regarding this visit. CMA/ATC served as Education administrator during this visit. History, Physical, and Plan performed by medical provider. Documentation and orders reviewed and attested to.      Gerda Diss, [place] [facility] Physician

## 2017-07-21 ENCOUNTER — Ambulatory Visit: Payer: BLUE CROSS/BLUE SHIELD | Admitting: Sports Medicine

## 2017-07-28 ENCOUNTER — Encounter: Payer: Self-pay | Admitting: Sports Medicine

## 2017-08-03 ENCOUNTER — Encounter: Payer: Self-pay | Admitting: Sports Medicine

## 2017-08-03 ENCOUNTER — Ambulatory Visit (INDEPENDENT_AMBULATORY_CARE_PROVIDER_SITE_OTHER): Payer: BLUE CROSS/BLUE SHIELD

## 2017-08-03 ENCOUNTER — Ambulatory Visit: Payer: BLUE CROSS/BLUE SHIELD | Admitting: Sports Medicine

## 2017-08-03 VITALS — BP 110/78 | HR 63 | Ht 67.0 in | Wt 226.8 lb

## 2017-08-03 DIAGNOSIS — S62101D Fracture of unspecified carpal bone, right wrist, subsequent encounter for fracture with routine healing: Secondary | ICD-10-CM | POA: Diagnosis not present

## 2017-08-03 DIAGNOSIS — S52571A Other intraarticular fracture of lower end of right radius, initial encounter for closed fracture: Secondary | ICD-10-CM | POA: Diagnosis not present

## 2017-08-03 NOTE — Progress Notes (Signed)
Erica Burke. Rigby, [name]  Erica Burke - 72 y.o. female MRN [id number]  Date of birth: [birth year]  Visit Date: 08/03/2017  PCP: Marin Olp, MD   Referred by: Marin Olp, MD   Scribe for today's visit: Josepha Pigg, CMA     SUBJECTIVE:  Kary Sugrue is here for Follow-up (RT wrist fracture)  Compared to the last office visit, her previously described symptoms are improving, she was still having pain up until this past Saturday and pain finally started to resolve.  Current symptoms are mild, like stubbing a toe, & are nonradiating She has been wearing Exos splint daily, only taking it off to shower. She hasn't had much pain during shower but tries not to rotate the wrist. She continues to have swelling in wrist and fingers.    ROS Denies night time disturbances. Denies fevers, chills, or night sweats. Denies unexplained weight loss. Denies personal history of cancer. Denies changes in bowel or bladder habits. Denies recent unreported falls. Denies new or worsening dyspnea or wheezing. Denies headaches or dizziness.  Denies numbness, tingling or weakness  In the extremities.  Denies dizziness or presyncopal episodes Denies lower extremity edema     HISTORY & PERTINENT PRIOR DATA:  Prior History reviewed and updated per electronic medical record.  Significant history, findings, studies and interim changes include:  reports that  has never smoked. she has never used smokeless tobacco. Recent Labs    03/04/17 0930  HGBA1C 6.2   No specialty comments available. No problems updated.  OBJECTIVE:  VS:  HT:5\' 7"  (170.2 cm)   WT:226 lb 12.8 oz (102.9 kg)  BMI:35.51    BP:110/78  HR:63bpm  TEMP: ( )  RESP:98 %   PHYSICAL EXAM: Constitutional: WDWN, Non-toxic appearing. Psychiatric: Alert & appropriately interactive.  Not depressed or anxious appearing. Respiratory: No increased work of  breathing.  Trachea Midline Eyes: Pupils are equal.  EOM intact without nystagmus.  No scleral icterus  NEUROVASCULAR exam: No clubbing or cyanosis appreciated No significant venous stasis changes Capillary Refill: normal, less than 2 seconds    Findings:  Wrist is overall well aligned without significant deformity.  She has a small amount of dorsal swelling across the wrist with mild pain across the distal carpal row is nonfocal.  No focal pain across the scapholunate interval or scaphoid bony prominences. Small amount of pain with palpation of the distal radius more focally over the dorsal aspect over the fracture site but this is mild.  Minimal pain along the radial aspect of the radius.  No pain to palpation of the palmar side of the distal radius.  Minimal pain with squeeze test of the DRUJ    ASSESSMENT & PLAN:   1. Closed fracture of right wrist with routine healing, subsequent encounter    PLAN: Overall she is doing quite well.  She is 6 weeks status post fracture.  X-rays are stable compared to last obtained.  We will have her begin working on gentle range of motion exercises while continuing with intermittent immobilization over the next 2 weeks try to minimize any type of recurrent injury still.  Follow-up in 4 weeks to ensure clinical resolution I like to see her completely weaned out of the brace at that time.  No problem-specific Assessment & Plan notes found for this encounter.   ++++++++++++++++++++++++++++++++++++++++++++ Orders & Meds: Orders Placed This Encounter  Procedures  . DG Wrist Complete Right  No orders of the defined types were placed in this encounter.   ++++++++++++++++++++++++++++++++++++++++++++ Follow-up: Return in about 4 weeks (around 08/31/2017) for repeat clinical exam.   Pertinent documentation may be included in additional procedure notes, imaging studies, problem based documentation and patient instructions. Please see these sections of the  encounter for additional information regarding this visit. CMA/ATC served as Education administrator during this visit. History, Physical, and Plan performed by medical provider. Documentation and orders reviewed and attested to.      Gerda Diss, [place] [facility] Physician

## 2017-08-04 ENCOUNTER — Ambulatory Visit: Payer: BLUE CROSS/BLUE SHIELD | Admitting: Sports Medicine

## 2017-08-19 ENCOUNTER — Encounter: Payer: Self-pay | Admitting: Sports Medicine

## 2017-09-01 ENCOUNTER — Ambulatory Visit: Payer: BLUE CROSS/BLUE SHIELD | Admitting: Sports Medicine

## 2017-09-01 ENCOUNTER — Encounter: Payer: Self-pay | Admitting: Sports Medicine

## 2017-09-01 VITALS — BP 112/70 | HR 64 | Ht 67.0 in | Wt 215.8 lb

## 2017-09-01 DIAGNOSIS — S62101D Fracture of unspecified carpal bone, right wrist, subsequent encounter for fracture with routine healing: Secondary | ICD-10-CM

## 2017-09-01 NOTE — Progress Notes (Signed)
  Erica Burke - 72 y.o. female MRN [id number]  Date of birth: Sep 04, 1945  Scribe for today's visit: Wendy Poet, LAT, ATC     SUBJECTIVE:  Erica Burke is here for Follow-up (R wrist fracture) .   Notes from [place] on 08/03/17 Compared to the last office visit, her previously described symptoms are improving, she was still having pain up until this past Saturday and pain finally started to resolve.  Current symptoms are mild, like stubbing a toe, & are nonradiating She has been wearing Exos splint daily, only taking it off to shower. She hasn't had much pain during shower but tries not to rotate the wrist. She continues to have swelling in wrist and fingers.    Compared to the last office visit on 08/03/17, her previously described R wrist pain symptoms are improving w/ decreased pain and improved R wrist ROM. Current symptoms are mild & are nonradiating She has weaned out of her Exos splint and is no longer wearing it as of Saturday (08/29/17) night and is working on gentle wrist ROM.   ROS Denies night time disturbances. Denies fevers, chills, or night sweats. Denies unexplained weight loss. Denies personal history of cancer. Denies changes in bowel or bladder habits. Denies recent unreported falls. Denies new or worsening dyspnea or wheezing. Denies headaches or dizziness.  Denies numbness, tingling or weakness  In the extremities.  Denies dizziness or presyncopal episodes Denies lower extremity edema      Please see additional documentation for Objective, Assessment and Plan sections. Pertinent additional documentation may be included in corresponding procedure notes, imaging studies, problem based documentation and patient instructions. Please see these sections of the encounter for additional information regarding this visit.  CMA/ATC served as Education administrator during this visit. History, Physical, and Plan performed by medical provider. Documentation and orders reviewed and attested to.    Gerda Diss, [place] [facility] Physician

## 2017-09-01 NOTE — Assessment & Plan Note (Signed)
No significant bony tenderness at this time.  She is at great improvement in her range of motion.  She has some limited flexion extension and is noticed that her grip strength has slightly diminished but overall doing significantly better.  She will follow-up as needed.  Gentle stretching recommended.

## 2017-09-01 NOTE — Progress Notes (Signed)
   Erica Burke. Rigby, [place] at [facility] [phone]  Erica Burke - 72 y.o. female MRN [id number]  Date of birth: 1945-07-14  Visit Date:   PCP: Marin Olp, MD   Referred by: Marin Olp, MD  Please see additional documentation for HPI, review of systems.  HISTORY & PERTINENT PRIOR DATA:  Prior History reviewed and updated per electronic medical record.  Significant history, findings, studies and interim changes include:  reports that  has never smoked. she has never used smokeless tobacco. Recent Labs    03/04/17 0930  HGBA1C 6.2   No specialty comments available. Problem  Closed Fracture of Right Wrist   Most recent XR R wrist 08/03/17 -overall good improvement.     OBJECTIVE:  VS:  HT:5\' 7"  (170.2 cm)   WT:215 lb 12.8 oz (97.9 kg)  BMI:33.79    BP:112/70  HR:64bpm  TEMP: ( )  RESP:95 %   PHYSICAL EXAM: WDWN, Non-toxic appearing. Psychiatric: Alert & appropriately interactive.  Not depressed or anxious appearing. Respiratory: No increased work of breathing.  Trachea Midline Eyes: Pupils are equal.  EOM intact without nystagmus.  No scleral icterus  NEUROVASCULAR exam: No clubbing or cyanosis appreciated No significant venous stasis changes normal, less than 2 seconds  Extremities warm to touch, pink, with no edema.    Right wrist  Skin: No overlying erythema/ecchymosis Effusion: none   Generalized Synovitis: none Tenderness: None and No focal bony TTP   RANGE OF MOTION & STRENGTH  limited flex extension   SPECIALITY TESTING:  Grip strength intact.  Somewhat limited range of motion.    ASSESSMENT & PLAN:   1. Closed fracture of right wrist with routine healing, subsequent encounter    Orders & Meds: No orders of the defined types were placed in this encounter.  No orders of the defined types were placed in this encounter.   PLAN:  doing great  work on ROM    Closed fracture of right  wrist No significant bony tenderness at this time.  She is at great improvement in her range of motion.  She has some limited flexion extension and is noticed that her grip strength has slightly diminished but overall doing significantly better.  She will follow-up as needed.  Gentle stretching recommended.   Follow-up: Return if symptoms worsen or fail to improve.

## 2017-09-16 ENCOUNTER — Encounter: Payer: Self-pay | Admitting: Sports Medicine

## 2017-09-16 ENCOUNTER — Ambulatory Visit (INDEPENDENT_AMBULATORY_CARE_PROVIDER_SITE_OTHER): Payer: BLUE CROSS/BLUE SHIELD

## 2017-09-16 ENCOUNTER — Ambulatory Visit: Payer: BLUE CROSS/BLUE SHIELD | Admitting: Sports Medicine

## 2017-09-16 VITALS — BP 120/82 | HR 56 | Ht 67.0 in | Wt 217.8 lb

## 2017-09-16 DIAGNOSIS — M25531 Pain in right wrist: Secondary | ICD-10-CM | POA: Diagnosis not present

## 2017-09-16 DIAGNOSIS — S62101D Fracture of unspecified carpal bone, right wrist, subsequent encounter for fracture with routine healing: Secondary | ICD-10-CM

## 2017-09-16 DIAGNOSIS — S62101P Fracture of unspecified carpal bone, right wrist, subsequent encounter for fracture with malunion: Secondary | ICD-10-CM

## 2017-09-16 NOTE — Patient Instructions (Addendum)
We are ordering an CT for you today.  The imaging office will be calling you to schedule your appointment after we obtain authorization from your insurance company.   Please be sure you have signed up for MyChart so that we can get your results to you.  We will be in touch with you as soon as we can.  Please know, it can take up to 3-4 business days for the radiologist and Dr. Paulla Fore to have time to review the results and determine the best appropriate action.  If there is something that appears to be surgical or needs a referral to other specialists we will let you know through [facility] or telephone.  Otherwise we will plan to schedule a follow up appointment with Dr. Paulla Fore once we have the results.

## 2017-09-16 NOTE — Progress Notes (Signed)
Erica Burke. Erica Burke, [facility]  Erica Burke - 72 y.o. female MRN [id number]  Date of birth: 1945/09/12  Visit Date: 09/16/2017  PCP: Erica Olp, MD   Referred by: Erica Olp, MD  Scribe for today's visit: Erica Burke, CMA     SUBJECTIVE:  Erica Burke is here for Follow-up (R wrist fx)  Notes from [place] on 08/03/17 Compared to the last office visit, her previously described symptoms are improving, she was still having pain up until this past Saturday and pain finally started to resolve.  Current symptoms are mild, like stubbing a toe, & are nonradiating She has been wearing Exos splint daily, only taking it off to shower. She hasn't had much pain during shower but tries not to rotate the wrist. She continues to have swelling in wrist and fingers.    09/01/17: Compared to the last office visit on 08/03/17, her previously described R wrist pain symptoms are improving w/ decreased pain and improved R wrist ROM. Current symptoms are mild & are nonradiating She has weaned out of her Exos splint and is no longer wearing it as of Saturday (08/29/17) night and is working on gentle wrist ROM.   09/16/17: Compared to the last office visit, her previously described symptoms are worsening. Current symptoms are moderate & are radiating to the lower arm. Pain is worse on lateral side. The pain comes and goes. Pain present at times when she uses it, at other times she is at rest and has pain. It wakes her from sleep at night. She denies swelling around the wrist. She has weakness in the hand but no more than normal . She has not been wearing exos brace. She had manipulation on the wrist in the past with some relief. She has been doing home exercises with little trouble.   ROS Reports night time disturbances. Denies fevers, chills, or night sweats. Denies unexplained weight loss. Denies personal history of cancer. Denies  changes in bowel or bladder habits. Denies recent unreported falls. Denies new or worsening dyspnea or wheezing. Denies headaches or dizziness.  Denies numbness, tingling or weakness  In the extremities.  Denies dizziness or presyncopal episodes Denies lower extremity edema    HISTORY & PERTINENT PRIOR DATA:  Prior History reviewed and updated per electronic medical record.  Significant/pertinent history, findings, studies include:  reports that she has never smoked. She has never used smokeless tobacco. Recent Labs    03/04/17 0930  HGBA1C 6.2   No specialty comments available. No problems updated.  OBJECTIVE:  VS:  HT:5\' 7"  (170.2 cm)   WT:217 lb 12.8 oz (98.8 kg)  BMI:34.1    BP:120/82  HR:(Abnormal) 56bpm  TEMP: ( )  RESP:95 %   PHYSICAL EXAM: Constitutional: WDWN, Non-toxic appearing. Psychiatric: Alert & appropriately interactive.  Not depressed or anxious appearing. Respiratory: No increased work of breathing.  Trachea Midline Eyes: Pupils are equal.  EOM intact without nystagmus.  No scleral icterus  Vascular Exam: warm to touch no edema Radial Pulses: normal  upper extremity neuro exam: unremarkable normal strength normal sensation  MSK Exam: Moderate TTP over the distal radial styloid and DRUJ.  Small amount of swelling within this.  Limited range of motion with no flexion and extension as well as ulnar deviation and radial deviation   ASSESSMENT & PLAN:   1. Closed fracture of right wrist with routine healing, subsequent encounter     PLAN: Unfortunately  I think that she may have a malunion.  She has minimal pain with Erica Burke testing which can be caused by this type of fracture.  We will set her up for a CT scan of the wrist to evaluate for malunion and if positive will need to consider bone stimulator.  She has only mild collapse on the x-rays but is actually stable compared to prior views.  Pain she is having is out of proportion to exam which is  concerning for malunion.  Follow-up: Will call to discuss follow-up after CT results.     Please see additional documentation for Objective, Assessment and Plan sections. Pertinent additional documentation may be included in corresponding procedure notes, imaging studies, problem based documentation and patient instructions. Please see these sections of the encounter for additional information regarding this visit.  CMA/ATC served as Education administrator during this visit. History, Physical, and Plan performed by medical provider. Documentation and orders reviewed and attested to.      Erica Burke, [facility] Physician

## 2017-09-16 NOTE — Progress Notes (Signed)
X-Rays obtained at [facility] Interpreted by myself Gerda Diss, DO) during office visit.  Results were reviewed with the patient at the time of the visit.   3 VIEW X-RAY of: Right wrist  Comminuted but minimally displaced intra-articular fracture that has showed overall good stability.  No evidence of persistent or worsening collapse.  Mild degenerative changes otherwise.

## 2017-09-18 ENCOUNTER — Ambulatory Visit (INDEPENDENT_AMBULATORY_CARE_PROVIDER_SITE_OTHER)
Admission: RE | Admit: 2017-09-18 | Discharge: 2017-09-18 | Disposition: A | Discharge status: Home / Self Care | Payer: BLUE CROSS/BLUE SHIELD | Source: Ambulatory Visit | Attending: Sports Medicine | Admitting: Sports Medicine

## 2017-09-18 DIAGNOSIS — S52501A Unspecified fracture of the lower end of right radius, initial encounter for closed fracture: Secondary | ICD-10-CM | POA: Diagnosis not present

## 2017-09-18 DIAGNOSIS — S62101P Fracture of unspecified carpal bone, right wrist, subsequent encounter for fracture with malunion: Secondary | ICD-10-CM | POA: Diagnosis not present

## 2017-09-21 ENCOUNTER — Telehealth: Payer: Self-pay | Admitting: Family Medicine

## 2017-09-21 NOTE — Progress Notes (Signed)
My Chart Message sent:  CT shows that it is healing but has not completed yet.  It is okay to use the brace as we discussed and I would like to check back in with you in 4 more weeks.

## 2017-09-21 NOTE — Telephone Encounter (Signed)
Forwarding to Dr. Paulla Fore to advise regarding CT results.

## 2017-09-21 NOTE — Telephone Encounter (Signed)
See result note.  

## 2017-09-21 NOTE — Telephone Encounter (Signed)
Copied from [place] [id number]. Topic: Quick Communication - Lab Results >> Sep 21, 2017  2:21 PM Darl Householder, RMA wrote: Patient is requesting results from CT wrist, please return pt call at [phone]

## 2017-10-01 ENCOUNTER — Encounter: Payer: Self-pay | Admitting: Sports Medicine

## 2017-10-01 ENCOUNTER — Other Ambulatory Visit: Payer: Self-pay

## 2017-10-01 DIAGNOSIS — S62101D Fracture of unspecified carpal bone, right wrist, subsequent encounter for fracture with routine healing: Secondary | ICD-10-CM

## 2017-10-05 ENCOUNTER — Other Ambulatory Visit: Payer: Self-pay

## 2017-10-05 MED ORDER — OLMESARTAN MEDOXOMIL-HCTZ 20-12.5 MG PO TABS: ORAL_TABLET | ORAL | 3 refills | Status: DC

## 2017-10-22 ENCOUNTER — Encounter: Payer: Self-pay | Admitting: Sports Medicine

## 2017-10-24 DIAGNOSIS — S52501P Unspecified fracture of the lower end of right radius, subsequent encounter for closed fracture with malunion: Secondary | ICD-10-CM | POA: Diagnosis not present

## 2017-10-24 DIAGNOSIS — M25531 Pain in right wrist: Secondary | ICD-10-CM | POA: Diagnosis not present

## 2017-10-27 DIAGNOSIS — S6291XA Unspecified fracture of right wrist and hand, initial encounter for closed fracture: Secondary | ICD-10-CM | POA: Diagnosis not present

## 2017-11-10 DIAGNOSIS — S6291XA Unspecified fracture of right wrist and hand, initial encounter for closed fracture: Secondary | ICD-10-CM | POA: Diagnosis not present

## 2017-12-10 DIAGNOSIS — H02401 Unspecified ptosis of right eyelid: Secondary | ICD-10-CM | POA: Diagnosis not present

## 2017-12-10 DIAGNOSIS — D4981 Neoplasm of unspecified behavior of retina and choroid: Secondary | ICD-10-CM | POA: Diagnosis not present

## 2017-12-10 DIAGNOSIS — H04123 Dry eye syndrome of bilateral lacrimal glands: Secondary | ICD-10-CM | POA: Diagnosis not present

## 2018-07-19 ENCOUNTER — Other Ambulatory Visit: Payer: Self-pay | Admitting: Family Medicine

## 2018-07-20 ENCOUNTER — Other Ambulatory Visit: Payer: Self-pay | Admitting: Family Medicine

## 2018-07-20 DIAGNOSIS — Z1231 Encounter for screening mammogram for malignant neoplasm of breast: Secondary | ICD-10-CM

## 2018-08-20 ENCOUNTER — Ambulatory Visit
Admission: RE | Admit: 2018-08-20 | Discharge: 2018-08-20 | Disposition: A | Discharge status: Home / Self Care | Payer: BLUE CROSS/BLUE SHIELD | Source: Ambulatory Visit | Attending: Family Medicine | Admitting: Family Medicine

## 2018-08-20 DIAGNOSIS — Z1231 Encounter for screening mammogram for malignant neoplasm of breast: Secondary | ICD-10-CM | POA: Diagnosis not present

## 2018-10-18 ENCOUNTER — Other Ambulatory Visit: Payer: Self-pay | Admitting: Family Medicine

## 2018-10-18 NOTE — Telephone Encounter (Signed)
Patient need to schedule an ov for more refills. 

## 2018-10-18 NOTE — Telephone Encounter (Signed)
Rx filled for 30 days no refill

## 2018-11-14 ENCOUNTER — Other Ambulatory Visit: Payer: Self-pay | Admitting: Family Medicine

## 2018-11-15 NOTE — Telephone Encounter (Signed)
Patient need to schedule an ov for more refills. 

## 2018-11-15 NOTE — Telephone Encounter (Signed)
Left message to return phone call.

## 2018-11-18 NOTE — Telephone Encounter (Signed)
Left message to return phone call. Rx filled for 30 days.

## 2018-12-07 DIAGNOSIS — M9901 Segmental and somatic dysfunction of cervical region: Secondary | ICD-10-CM | POA: Diagnosis not present

## 2018-12-07 DIAGNOSIS — M9908 Segmental and somatic dysfunction of rib cage: Secondary | ICD-10-CM | POA: Diagnosis not present

## 2018-12-07 DIAGNOSIS — M9902 Segmental and somatic dysfunction of thoracic region: Secondary | ICD-10-CM | POA: Diagnosis not present

## 2018-12-08 DIAGNOSIS — M9908 Segmental and somatic dysfunction of rib cage: Secondary | ICD-10-CM | POA: Diagnosis not present

## 2018-12-08 DIAGNOSIS — M9901 Segmental and somatic dysfunction of cervical region: Secondary | ICD-10-CM | POA: Diagnosis not present

## 2018-12-08 DIAGNOSIS — M9902 Segmental and somatic dysfunction of thoracic region: Secondary | ICD-10-CM | POA: Diagnosis not present

## 2018-12-13 ENCOUNTER — Encounter: Payer: Self-pay | Admitting: Family Medicine

## 2018-12-13 ENCOUNTER — Other Ambulatory Visit: Payer: Self-pay

## 2018-12-13 ENCOUNTER — Other Ambulatory Visit: Payer: Self-pay | Admitting: Family Medicine

## 2018-12-13 ENCOUNTER — Ambulatory Visit (INDEPENDENT_AMBULATORY_CARE_PROVIDER_SITE_OTHER): Payer: BC Managed Care – PPO | Admitting: Family Medicine

## 2018-12-13 VITALS — BP 128/78 | HR 68 | Temp 98.6°F | Ht 67.0 in | Wt 235.6 lb

## 2018-12-13 DIAGNOSIS — E785 Hyperlipidemia, unspecified: Secondary | ICD-10-CM | POA: Diagnosis not present

## 2018-12-13 DIAGNOSIS — M8000XS Age-related osteoporosis with current pathological fracture, unspecified site, sequela: Secondary | ICD-10-CM

## 2018-12-13 DIAGNOSIS — R739 Hyperglycemia, unspecified: Secondary | ICD-10-CM

## 2018-12-13 DIAGNOSIS — Z23 Encounter for immunization: Secondary | ICD-10-CM | POA: Diagnosis not present

## 2018-12-13 DIAGNOSIS — Z0000 Encounter for general adult medical examination without abnormal findings: Secondary | ICD-10-CM | POA: Diagnosis not present

## 2018-12-13 DIAGNOSIS — Z9989 Dependence on other enabling machines and devices: Secondary | ICD-10-CM

## 2018-12-13 DIAGNOSIS — I10 Essential (primary) hypertension: Secondary | ICD-10-CM | POA: Diagnosis not present

## 2018-12-13 DIAGNOSIS — E669 Obesity, unspecified: Secondary | ICD-10-CM

## 2018-12-13 DIAGNOSIS — G4733 Obstructive sleep apnea (adult) (pediatric): Secondary | ICD-10-CM

## 2018-12-13 MED ORDER — OLMESARTAN MEDOXOMIL 20 MG PO TABS: 20.0000 mg | ORAL_TABLET | Freq: Every day | ORAL | 3 refills | Status: DC

## 2018-12-13 MED ORDER — HYDROCHLOROTHIAZIDE 25 MG PO TABS: 12.5000 mg | ORAL_TABLET | Freq: Every day | ORAL | 3 refills | Status: DC | PRN

## 2018-12-13 NOTE — Assessment & Plan Note (Signed)
Osteoporosis screening- had done at [facility] center 03/09/15 . Shows T score -1.1 in lumbar spine. Normal both hips. Discussed repeat 2019 or 2020 at last CPE.  Recommended 1000 units a day even when not on boost- she wants to recheck levels and see if we can do another 50k weekly treatment round.   ended up with fagility fracture and treated by Dr. Paulla Fore 06/2017- she did not follow up so was never started on bisphosphonate/never had dexa. Will update bone density and likely recommend fosamax/calcium/vitamin D

## 2018-12-13 NOTE — Assessment & Plan Note (Signed)
Took benicar 20mg  last night- from PA at work. She has been taking this for the most part due to low blood pressures and feeling poorly at times on benicar HCT 20-12.5 mg. If she takes only benicar 20mg  though blood pressure can creep up on her- and she ends up taking the benicar-hct and this helps. Previously with the benicar hct 20-12.5mg  would feel exhausted with low blood pressure -today we opted to change to olmesartan 20mg  every night - then if blood pressure gets >140/90 can take hctz 12.5mg .

## 2018-12-13 NOTE — Patient Instructions (Addendum)
Shingrix #1 today. Repeat injection in 2-5 months. Schedule a nurse visit for the 2nd injection before you leave today (at the check out desk)  Schedule a lab visit at the check out desk within 2 weeks. Return for future fasting labs meaning nothing but water after midnight please. Ok to take your medications with water.   Recommend 150 minutes exercise per week (build up slowly to this) and also calorie counting can be helpful for weight loss   Use benicar alone nightly.  If blood pressure gets >140/90- can take hctz half tablet daily along with it as needed to keep pressure down

## 2018-12-13 NOTE — Progress Notes (Signed)
Phone: [phone]   Subjective:  Patient presents today for their annual physical. Chief complaint-noted.   See problem oriented charting- ROS- full  review of systems was completed and negative including No fever, chills, cough, congestion, runny nose, shortness of breath, fatigue, body aches, sore throat, headache, nausea, vomiting, diarrhea, or new loss of taste or smell. No known contacts with covid 19 or someone being tested for covid 19.    The following were reviewed and entered/updated in epic: Past Medical History:  Diagnosis Date   Hyperlipidemia    Hypertension    Sleep apnea    uses cpap   TRANSAMINASES, SERUM, ELEVATED 10/19/2008   Mild elevations - resolved     Patient Active Problem List   Diagnosis Date Noted   Osteoporosis 03/04/2017    Priority: High   Hyperglycemia 12/01/2014    Priority: Medium   Essential hypertension 10/19/2008    Priority: Medium   Hyperlipidemia 01/25/2007    Priority: Medium   Closed fracture of one rib of right side 06/24/2017    Priority: Low   Closed fracture of right wrist 06/24/2017    Priority: Low   Polyp of colon, adenomatous 02/06/2015    Priority: Low   Obesity, Class II, BMI 35-39.9 04/19/2010    Priority: Low   OSA on CPAP 07/11/2008    Priority: Low   Past Surgical History:  Procedure Laterality Date   ABDOMINAL HYSTERECTOMY     severe bleeding, still has ovaries   cataract surgery     bilateral   FLEXIBLE SIGMOIDOSCOPY  2001    Family History  Problem Relation Age of Onset   Stroke Mother    Stroke Father    Hypertension Other    Lung cancer Brother        x2 smokers   Scleroderma Brother        "died from thrombosis"   Other Sister        spinal muscular atrophy- 1 sister, 2 brothers   Colon cancer Neg Hx     Medications- reviewed and updated Current Outpatient Medications  Medication Sig Dispense Refill   fluticasone (FLONASE) 50 MCG/ACT nasal spray SPRAY 2 SPRAYS INTO  EACH NOSTRIL EVERY DAY 48 g 3   Multiple Vitamin (MULTIVITAMIN) capsule Take 1 capsule by mouth daily.       hydrochlorothiazide (HYDRODIURIL) 25 MG tablet Take 0.5 tablets (12.5 mg total) by mouth daily as needed (if blood pressure >140/90). 45 tablet 3   olmesartan (BENICAR) 20 MG tablet Take 1 tablet (20 mg total) by mouth daily. 90 tablet 3   No current facility-administered medications for this visit.     Allergies-reviewed and updated Allergies  Allergen Reactions   Tetanus-Diphtheria Toxoids Td     Severe rash, fever, hospitalized.    Codeine Sulfate     REACTION: unspecified    Social History   Social History Narrative   Married (49 years in [facility] patient of Dr. Yong Channel). 3 children. 5 grandkids.       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 Oropharynx normal Neck: no thyromegaly CV: RRR no murmurs rubs or gallops Lungs: CTAB no crackles, wheeze, rhonchi Abdomen: soft/nontender/nondistended/normal bowel sounds. No rebound or guarding.  Ext: no edema Skin: warm, dry Neuro: grossly normal, moves all extremities, PERRLA   Assessment and Plan    73 y.o. female presenting for annual physical.  Health Maintenance counseling: 1. Anticipatory guidance: Patient counseled regarding regular dental exams -q6 months encouraged- she doesn't always make this, eye exams yearly,  avoiding smoking and second hand smoke , limiting alcohol to 1 beverage per day- doesn't drink .   2. Risk factor reduction:  Advised patient of need for regular exercise and diet rich and fruits and vegetables to reduce risk of heart  attack and stroke. Exercise- covid 19 has been tough on her- no exercise other than yardwork- 150 minutes a week recommended. Diet-weight up 5 lbs since last cpe- has slipped up with covid 19 . Morbid obesity noted with bmi >35 and HTN, HLD- Encouraged need for healthy eating, regular exercise, weight loss.  Wt Readings from Last 3 Encounters:  12/13/18 235 lb 9.6 oz (106.9 kg)  09/16/17 217 lb 12.8 oz (98.8 kg)  09/01/17 215 lb 12.8 oz (97.9 kg)  3. Immunizations/screenings/ancillary studies- discussed shingrix and side effects- she opts in.  Immunization History  Administered Date(s) Administered   Influenza-Unspecified 03/16/2014   Pneumococcal Conjugate-13 12/01/2014   Pneumococcal Polysaccharide-23 12/17/2011   Zoster 12/17/2011  4. Cervical cancer screening- passed age based screening. No history abnormal pap. Not seeing gynecology.  5. Breast cancer screening-  breast exam declined and mammogram 08/20/2018 and had 3d 6. Colon cancer screening - 01/2015 with 5 year follow up due to adenoma  7. Skin cancer screening- has seen derm in past- no recent visit. advised regular sunscreen use. Denies worrisome, changing, or new skin lesions.  8. Birth control/STD check- monogamous and postmenopausal 9. Osteoporosis screening at 65-      -1.1 in lumbar spine last checked 2016- offered repeat. She wants to wait until next year-  10. Never smoker  Status of chronic or acute concerns   OSA on Cpap- needs new machine/supplies with hers over 15 years old- will refer to pulmonology. vitamin d checked at work and was ok after 50k weekly boost.   Osteoporosis Osteoporosis screening- had done at breast center 03/09/15 . Shows T score -1.1 in lumbar spine. Normal both hips. Discussed repeat 2019 or 2020 at last CPE.  Recommended 1000 units a day even when not on boost- she wants to recheck levels and see if we can do another 50k weekly treatment round.   ended up with fagility fracture and treated by  Dr. Paulla Fore 06/2017- she did not follow up so was never started on bisphosphonate/never had dexa. Will update bone density and likely recommend fosamax/calcium/vitamin D   Essential hypertension Took benicar 20mg  last night- from PA at work. She has been taking this for the most part due to low blood pressures and feeling poorly at times on benicar HCT 20-12.5 mg. If she takes only benicar 20mg  though blood pressure can creep up on her- and she ends up taking the benicar-hct and this helps. Previously with the benicar hct 20-12.5mg  would feel exhausted with low blood pressure -today we opted to change to olmesartan 20mg  every night - then if blood pressure gets >140/90 can take hctz 12.5mg .    Hyperlipidemia HLD- has wanted to use 12% cut off for starting statin  and has not crossed 10% threshold yet- for primary prevention. No rx- fish oil in past. Update lipids and recalculate risks- discussed new guideline 7.5%- she wants to stick with higher threshold most likely.  -starting statin may increase DM risk too- also discussed heavy push for healthy eating/regular exercise.   Hyperglycemia Hyperglycemia- update cbg and a1c - will return for fasting labs Lab Results  Component Value Date   HGBA1C 6.2 03/04/2017    Osteoporosis Osteoporosis screening- had done at breast center 03/09/15 . Shows T score -1.1 in lumbar spine. Normal both hips. Discussed repeat 2019 or 2020 at last CPE.  Recommended 1000 units a day even when not on boost- she wants to recheck levels and see if we can do another 50k weekly treatment round.   ended up with fagility fracture and treated by Dr. Paulla Fore 06/2017- she did not follow up so was never started on bisphosphonate/never had dexa. Will update bone density and likely recommend fosamax/calcium/vitamin D  Essential hypertension Took benicar 20mg  last night- from PA at work. She has been taking this for the most part due to low blood pressures and feeling poorly at  times on benicar HCT 20-12.5 mg. If she takes only benicar 20mg  though blood pressure can creep up on her- and she ends up taking the benicar-hct and this helps. Previously with the benicar hct 20-12.5mg  would feel exhausted with low blood pressure -today we opted to change to olmesartan 20mg  every night - then if blood pressure gets >140/90 can take hctz 12.5mg .    Future Appointments  Date Time Provider [place]  12/16/2018  8:15 AM [facility] LAB [facility] PEC  02/10/2019  9:00 AM [facility] NURSE [facility]   Lab/Order associations:[facility]k retyrb fir fastubg kavs    ICD-10-CM   1. Preventative health care  Z00.00 Hemoglobin A1c    CBC    Comprehensive metabolic panel    Lipid panel    DG Bone Density  2. Hyperglycemia  R73.9 Hemoglobin A1c  3. Essential hypertension  I10 CBC    Comprehensive metabolic panel    Lipid panel  4. Hyperlipidemia, unspecified hyperlipidemia type  E78.5 CBC    Comprehensive metabolic panel    Lipid panel  5. Obesity, Class II, BMI 35-39.9  E66.9   6. Morbid obesity ([place])  E66.01   7. Osteoporosis with current pathological fracture, unspecified osteoporosis type, sequela  M80.00XS DG Bone Density    VITAMIN D 25 Hydroxy (Vit-D Deficiency, Fractures)  8. OSA on CPAP  G47.33 Ambulatory referral to Pulmonology   Z99.89    Meds ordered this encounter  Medications   olmesartan (BENICAR) 20 MG tablet    Sig: Take 1 tablet (20 mg total) by mouth daily.    Dispense:  90 tablet    Refill:  3   hydrochlorothiazide (HYDRODIURIL) 25 MG tablet    Sig: Take 0.5 tablets (12.5 mg total) by mouth daily as needed (if blood pressure >140/90).    Dispense:  45 tablet    Refill:  3   Return precautions advised.  Garret Reddish, MD

## 2018-12-15 DIAGNOSIS — M9908 Segmental and somatic dysfunction of rib cage: Secondary | ICD-10-CM | POA: Diagnosis not present

## 2018-12-15 DIAGNOSIS — M9902 Segmental and somatic dysfunction of thoracic region: Secondary | ICD-10-CM | POA: Diagnosis not present

## 2018-12-15 DIAGNOSIS — M9901 Segmental and somatic dysfunction of cervical region: Secondary | ICD-10-CM | POA: Diagnosis not present

## 2018-12-16 ENCOUNTER — Other Ambulatory Visit (INDEPENDENT_AMBULATORY_CARE_PROVIDER_SITE_OTHER): Payer: BC Managed Care – PPO

## 2018-12-16 ENCOUNTER — Other Ambulatory Visit: Payer: Self-pay

## 2018-12-16 DIAGNOSIS — E785 Hyperlipidemia, unspecified: Secondary | ICD-10-CM

## 2018-12-16 DIAGNOSIS — Z0000 Encounter for general adult medical examination without abnormal findings: Secondary | ICD-10-CM

## 2018-12-16 DIAGNOSIS — M8000XS Age-related osteoporosis with current pathological fracture, unspecified site, sequela: Secondary | ICD-10-CM | POA: Diagnosis not present

## 2018-12-16 DIAGNOSIS — R739 Hyperglycemia, unspecified: Secondary | ICD-10-CM | POA: Diagnosis not present

## 2018-12-16 DIAGNOSIS — I10 Essential (primary) hypertension: Secondary | ICD-10-CM

## 2018-12-16 LAB — CBC
HCT: 38 % (ref 36.0–46.0)
Hemoglobin: 13 g/dL (ref 12.0–15.0)
MCHC: 34.3 g/dL (ref 30.0–36.0)
MCV: 91.3 fl (ref 78.0–100.0)
Platelets: 239 10*3/uL (ref 150.0–400.0)
RBC: 4.16 Mil/uL (ref 3.87–5.11)
RDW: 13.5 % (ref 11.5–15.5)
WBC: 6.2 10*3/uL (ref 4.0–10.5)

## 2018-12-16 LAB — LIPID PANEL
Cholesterol: 199 mg/dL (ref 0–200)
HDL: 38.6 mg/dL — ABNORMAL LOW (ref >39.00)
LDL Cholesterol: 130 mg/dL — ABNORMAL HIGH (ref 0–99)
NonHDL: 160.6
Total CHOL/HDL Ratio: 5
Triglycerides: 155 mg/dL — ABNORMAL HIGH (ref 0.0–149.0)
VLDL: 31 mg/dL (ref 0.0–40.0)

## 2018-12-16 LAB — COMPREHENSIVE METABOLIC PANEL
ALT: 36 U/L — ABNORMAL HIGH (ref 0–35)
AST: 33 U/L (ref 0–37)
Albumin: 4.4 g/dL (ref 3.5–5.2)
Alkaline Phosphatase: 51 U/L (ref 39–117)
BUN: 17 mg/dL (ref 6–23)
CO2: 29 mEq/L (ref 19–32)
Calcium: 9.6 mg/dL (ref 8.4–10.5)
Chloride: 105 mEq/L (ref 96–112)
Creatinine, Ser: 0.91 mg/dL (ref 0.40–1.20)
GFR: 60.62 mL/min (ref >60.00)
Glucose, Bld: 105 mg/dL — ABNORMAL HIGH (ref 70–99)
Potassium: 4 mEq/L (ref 3.5–5.1)
Sodium: 142 mEq/L (ref 135–145)
Total Bilirubin: 0.5 mg/dL (ref 0.2–1.2)
Total Protein: 7.3 g/dL (ref 6.0–8.3)

## 2018-12-16 LAB — VITAMIN D 25 HYDROXY (VIT D DEFICIENCY, FRACTURES): VITD: 32.62 ng/mL (ref 30.00–100.00)

## 2018-12-16 LAB — HEMOGLOBIN A1C: Hgb A1c MFr Bld: 6.3 % (ref 4.6–6.5)

## 2018-12-20 ENCOUNTER — Encounter: Payer: Self-pay | Admitting: Family Medicine

## 2018-12-21 DIAGNOSIS — M9901 Segmental and somatic dysfunction of cervical region: Secondary | ICD-10-CM | POA: Diagnosis not present

## 2018-12-21 DIAGNOSIS — M9902 Segmental and somatic dysfunction of thoracic region: Secondary | ICD-10-CM | POA: Diagnosis not present

## 2018-12-21 DIAGNOSIS — M9908 Segmental and somatic dysfunction of rib cage: Secondary | ICD-10-CM | POA: Diagnosis not present

## 2018-12-24 ENCOUNTER — Encounter: Payer: Self-pay | Admitting: Family Medicine

## 2018-12-24 DIAGNOSIS — G4730 Sleep apnea, unspecified: Secondary | ICD-10-CM

## 2018-12-27 DIAGNOSIS — M9908 Segmental and somatic dysfunction of rib cage: Secondary | ICD-10-CM | POA: Diagnosis not present

## 2018-12-27 DIAGNOSIS — M9901 Segmental and somatic dysfunction of cervical region: Secondary | ICD-10-CM | POA: Diagnosis not present

## 2018-12-27 DIAGNOSIS — M9902 Segmental and somatic dysfunction of thoracic region: Secondary | ICD-10-CM | POA: Diagnosis not present

## 2019-01-05 ENCOUNTER — Encounter: Payer: Self-pay | Admitting: Family Medicine

## 2019-01-07 ENCOUNTER — Other Ambulatory Visit: Payer: Self-pay

## 2019-01-07 ENCOUNTER — Ambulatory Visit
Admission: RE | Admit: 2019-01-07 | Discharge: 2019-01-07 | Disposition: A | Discharge status: Home / Self Care | Payer: BC Managed Care – PPO | Source: Ambulatory Visit | Attending: Family Medicine | Admitting: Family Medicine

## 2019-01-07 DIAGNOSIS — M8588 Other specified disorders of bone density and structure, other site: Secondary | ICD-10-CM | POA: Diagnosis not present

## 2019-01-07 DIAGNOSIS — M8000XS Age-related osteoporosis with current pathological fracture, unspecified site, sequela: Secondary | ICD-10-CM

## 2019-01-07 DIAGNOSIS — Z0000 Encounter for general adult medical examination without abnormal findings: Secondary | ICD-10-CM

## 2019-01-07 DIAGNOSIS — Z780 Asymptomatic menopausal state: Secondary | ICD-10-CM | POA: Diagnosis not present

## 2019-01-08 ENCOUNTER — Encounter: Payer: Self-pay | Admitting: Family Medicine

## 2019-01-12 ENCOUNTER — Other Ambulatory Visit: Payer: Self-pay | Admitting: Family Medicine

## 2019-01-31 ENCOUNTER — Ambulatory Visit (INDEPENDENT_AMBULATORY_CARE_PROVIDER_SITE_OTHER): Payer: BC Managed Care – PPO | Admitting: Pulmonary Disease

## 2019-01-31 ENCOUNTER — Other Ambulatory Visit: Payer: Self-pay

## 2019-01-31 ENCOUNTER — Encounter: Payer: Self-pay | Admitting: Pulmonary Disease

## 2019-01-31 VITALS — BP 122/76 | HR 76 | Ht 67.0 in | Wt 229.0 lb

## 2019-01-31 DIAGNOSIS — Z8669 Personal history of other diseases of the nervous system and sense organs: Secondary | ICD-10-CM | POA: Diagnosis not present

## 2019-01-31 NOTE — Patient Instructions (Signed)
History of obstructive sleep apnea  Obtain a home sleep study  We will see you back in the office in about 3 months Call with significant concerns

## 2019-01-31 NOTE — Progress Notes (Signed)
Erica Burke    [id number]    1945/06/28  Primary Care Physician:Hunter, Brayton Mars, MD  Referring Physician: Marin Olp, [address] [place],  [place] [address]  Chief complaint:   Patient being seen for obstructive sleep apnea  HPI:  Diagnosed with obstructive sleep apnea over 20 years ago Has been using CPAP for the last 15 to 17 years Machine is dated  She feels she continues to benefit from CPAP use No significant changes in her health recently  No dryness of her mouth in the morning Feels well restored when waking up in the morning Stays active with no significant health issues  She will be retiring soon   Outpatient Encounter Medications as of 01/31/2019  Medication Sig  . fluticasone (FLONASE) 50 MCG/ACT nasal spray SPRAY 2 SPRAYS INTO EACH NOSTRIL EVERY DAY  . hydrochlorothiazide (HYDRODIURIL) 25 MG tablet Take 0.5 tablets (12.5 mg total) by mouth daily as needed (if blood pressure >140/90).  . Multiple Vitamin (MULTIVITAMIN) capsule Take 1 capsule by mouth daily.    Marland Kitchen olmesartan (BENICAR) 20 MG tablet Take 1 tablet (20 mg total) by mouth daily.   No facility-administered encounter medications on file as of 01/31/2019.     Allergies as of 01/31/2019 - Review Complete 01/31/2019  Allergen Reaction Noted  . Tetanus-diphtheria toxoids td    . Codeine sulfate      Past Medical History:  Diagnosis Date  . Hyperlipidemia   . Hypertension   . Sleep apnea    uses cpap  . TRANSAMINASES, SERUM, ELEVATED 10/19/2008   Mild elevations - resolved      Past Surgical History:  Procedure Laterality Date  . ABDOMINAL HYSTERECTOMY     severe bleeding, still has ovaries  . cataract surgery     bilateral  . FLEXIBLE SIGMOIDOSCOPY  2001    Family History  Problem Relation Age of Onset  . Stroke Mother   . Stroke Father   . Hypertension Other   . Lung cancer Brother        x2 smokers  . Scleroderma Brother        "died from thrombosis"  .  Other Sister        spinal muscular atrophy- 1 sister, 2 brothers  . Colon cancer Neg Hx     Social History   Socioeconomic History  . Marital status: Single    Spouse name: Not on file  . Number of children: Not on file  . Years of education: Not on file  . Highest education level: Not on file  Occupational History  . Not on file  Social Needs  . Financial resource strain: Not on file  . Food insecurity    Worry: Not on file    Inability: Not on file  . Transportation needs    Medical: Not on file    Non-medical: Not on file  Tobacco Use  . Smoking status: Never Smoker  . Smokeless tobacco: Never Used  Substance and Sexual Activity  . Alcohol use: No    Alcohol/week: 0.0 standard drinks  . Drug use: No  . Sexual activity: Not on file  Lifestyle  . Physical activity    Days per week: Not on file    Minutes per session: Not on file  . Stress: Not on file  Relationships  . Social Herbalist on phone: Not on file    Gets together: Not on file  Attends religious service: Not on file    Active member of club or organization: Not on file    Attends meetings of clubs or organizations: Not on file    Relationship status: Not on file  . Intimate partner violence    Fear of current or ex partner: Not on file    Emotionally abused: Not on file    Physically abused: Not on file    Forced sexual activity: Not on file  Other Topics Concern  . Not on file  Social History Narrative   Married (49 years in [facility] patient of Dr. Yong Channel). 3 children. 5 grandkids.       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  Respiratory: Negative for shortness of breath.   Psychiatric/Behavioral: Positive for sleep disturbance.    Vitals:   01/31/19 1114  BP: 122/76  Pulse: 76  SpO2: 97%     Physical Exam  Constitutional: She appears  well-developed and well-nourished.  HENT:  Head: Normocephalic and atraumatic.  Eyes: Right eye exhibits no discharge. Left eye exhibits no discharge.  Neck: Normal range of motion. Neck supple. No tracheal deviation present. No thyromegaly present.  Cardiovascular: Normal rate and regular rhythm.  Pulmonary/Chest: Effort normal and breath sounds normal. No respiratory distress. She has no wheezes. She has no rales.  Abdominal: Soft. Bowel sounds are normal. She exhibits no distension. There is no abdominal tenderness.    Assessment:  Known obstructive sleep apnea -Continues to benefit from CPAP use -Machine is dated  Hypertension is controlled  Plan/Recommendations: Pathophysiology of sleep disordered breathing discussed Treatment options discussed  We will schedule the patient for home sleep study  CPAP therapy  I will see her back in the office in 3 months   Sherrilyn Rist MD [facility] 01/31/2019, 11:34 AM  CC: Marin Olp, MD

## 2019-02-02 ENCOUNTER — Other Ambulatory Visit: Payer: Self-pay

## 2019-02-02 ENCOUNTER — Telehealth: Payer: BC Managed Care – PPO | Admitting: Nurse Practitioner

## 2019-02-02 DIAGNOSIS — Z20822 Contact with and (suspected) exposure to covid-19: Secondary | ICD-10-CM

## 2019-02-02 DIAGNOSIS — R52 Pain, unspecified: Secondary | ICD-10-CM

## 2019-02-02 DIAGNOSIS — J029 Acute pharyngitis, unspecified: Secondary | ICD-10-CM

## 2019-02-02 NOTE — Progress Notes (Signed)
E-Visit for Corona Virus Screening   Your current symptoms could be consistent with the coronavirus.  Many health care providers can now test patients at their office but not all are.  [place] has multiple testing sites. For information on our COVID testing locations and hours go to [email or web address]  Please quarantine yourself while awaiting your test results.  We are enrolling you in our [place] for [place] . Daily you will receive a questionnaire within the [facility] website. Our COVID 19 response team willl be monitoriing your responses daily.  You can go to one of the  testing sites listed below, while they are opened (see hours). You do not need a doctors order to be tested for covid. You do need to self-isolate until your results return and if positive 14 days from when your symptoms started and until you are 3 days symptom free.   Testing Locations (Monday - Friday, 8 a.m. - 3:30 p.m.) . [place]: [facility], [address] [place], [place]: [place], [place], [place], [place] (entrance off [address])  . [place] [address] [place], [place] (across from [facility] Emergency Department)    COVID-19 is a respiratory illness with symptoms that are similar to the flu. Symptoms are typically mild to moderate, but there have been cases of severe illness and death due to the virus. The following symptoms may appear 2-14 days after exposure: . Fever . Cough . Shortness of breath or difficulty breathing . Chills . Repeated shaking with chills . Muscle pain . Headache . Sore throat . New loss of taste or smell . Fatigue . Congestion or runny nose . Nausea or vomiting . Diarrhea  It is vitally important that if you feel that you have an infection such as this virus or any other virus that you stay home and away from places where you may  spread it to others.  You should self-quarantine for 14 days if you have symptoms that could potentially be coronavirus or have been in close contact a with a person diagnosed with COVID-19 within the last 2 weeks. You should avoid contact with people age 73 and older.   You should wear a mask or cloth face covering over your nose and mouth if you must be around other people or animals, including pets (even at home). Try to stay at least 6 feet away from other people. This will protect the people around you.  You can use medication such as delsym or mucinex OTC if cough develops  You may also take acetaminophen (Tylenol) as needed for fever.   Reduce your risk of any infection by using the same precautions used for avoiding the common cold or flu:  Marland Kitchen Wash your hands often with soap and warm water for at least 20 seconds.  If soap and water are not readily available, use an alcohol-based hand sanitizer with at least 60% alcohol.  . If coughing or sneezing, cover your mouth and nose by coughing or sneezing into the elbow areas of your shirt or coat, into a tissue or into your sleeve (not your hands). . Avoid shaking hands with others and consider head nods or verbal greetings only. . Avoid touching your eyes, nose, or mouth with unwashed hands.  . Avoid close contact with people who are sick. . Avoid places or events with large numbers of people in one location, like concerts or sporting  events. . Carefully consider travel plans you have or are making. . If you are planning any travel outside or inside the [place], visit the CDC's Travelers' Health webpage for the latest health notices. . If you have some symptoms but not all symptoms, continue to monitor at home and seek medical attention if your symptoms worsen. . If you are having a medical emergency, call 911.  HOME CARE . Only take medications as instructed by your medical team. . Drink plenty of fluids and get plenty of rest. . A steam or  ultrasonic humidifier can help if you have congestion.   GET HELP RIGHT AWAY IF YOU HAVE EMERGENCY WARNING SIGNS** FOR COVID-19. If you or someone is showing any of these signs seek emergency medical care immediately. Call 911 or proceed to your closest emergency facility if: . You develop worsening high fever. . Trouble breathing . Bluish lips or face . Persistent pain or pressure in the chest . New confusion . Inability to wake or stay awake . You cough up blood. . Your symptoms become more severe  **This list is not all possible symptoms. Contact your medical provider for any symptoms that are sever or concerning to you.   MAKE SURE YOU   Understand these instructions.  Will watch your condition.  Will get help right away if you are not doing well or get worse.  Your e-visit answers were reviewed by a board certified advanced clinical practitioner to complete your personal care plan.  Depending on the condition, your plan could have included both over the counter or prescription medications.  If there is a problem please reply once you have received a response from your provider.  Your safety is important to [place].  If you have drug allergies check your prescription carefully.    You can use MyChart to ask questions about today's visit, request a non-urgent call back, or ask for a work or school excuse for 24 hours related to this e-Visit. If it has been greater than 24 hours you will need to follow up with your provider, or enter a new e-Visit to address those concerns. You will get an e-mail in the next two days asking about your experience.  I hope that your e-visit has been valuable and will speed your recovery. Thank you for using e-visits.   5-10 minutes spent reviewing and documenting in chart.

## 2019-02-03 ENCOUNTER — Encounter (INDEPENDENT_AMBULATORY_CARE_PROVIDER_SITE_OTHER): Payer: Self-pay

## 2019-02-03 LAB — NOVEL CORONAVIRUS, NAA: SARS-CoV-2, NAA: NOT DETECTED

## 2019-02-04 ENCOUNTER — Encounter (INDEPENDENT_AMBULATORY_CARE_PROVIDER_SITE_OTHER): Payer: Self-pay

## 2019-02-05 ENCOUNTER — Encounter (INDEPENDENT_AMBULATORY_CARE_PROVIDER_SITE_OTHER): Payer: Self-pay

## 2019-02-07 ENCOUNTER — Encounter (INDEPENDENT_AMBULATORY_CARE_PROVIDER_SITE_OTHER): Payer: Self-pay

## 2019-02-08 ENCOUNTER — Encounter (INDEPENDENT_AMBULATORY_CARE_PROVIDER_SITE_OTHER): Payer: Self-pay

## 2019-02-10 ENCOUNTER — Other Ambulatory Visit: Payer: Self-pay

## 2019-02-10 ENCOUNTER — Ambulatory Visit (INDEPENDENT_AMBULATORY_CARE_PROVIDER_SITE_OTHER): Payer: BC Managed Care – PPO

## 2019-02-10 DIAGNOSIS — Z23 Encounter for immunization: Secondary | ICD-10-CM

## 2019-02-10 NOTE — Progress Notes (Signed)
Per orders of Dr. Yong Channel, injection of Shingrix  Given in L deltoid by Franco Collet. Patient tolerated injection well.

## 2019-02-11 ENCOUNTER — Encounter (INDEPENDENT_AMBULATORY_CARE_PROVIDER_SITE_OTHER): Payer: Self-pay

## 2019-02-12 ENCOUNTER — Encounter (INDEPENDENT_AMBULATORY_CARE_PROVIDER_SITE_OTHER): Payer: Self-pay

## 2019-02-18 ENCOUNTER — Other Ambulatory Visit: Payer: BC Managed Care – PPO

## 2019-02-28 ENCOUNTER — Ambulatory Visit: Payer: BC Managed Care – PPO

## 2019-02-28 ENCOUNTER — Other Ambulatory Visit: Payer: Self-pay

## 2019-02-28 DIAGNOSIS — Z8669 Personal history of other diseases of the nervous system and sense organs: Secondary | ICD-10-CM

## 2019-02-28 DIAGNOSIS — G4733 Obstructive sleep apnea (adult) (pediatric): Secondary | ICD-10-CM | POA: Diagnosis not present

## 2019-03-03 ENCOUNTER — Telehealth: Payer: Self-pay | Admitting: Pulmonary Disease

## 2019-03-03 DIAGNOSIS — G4733 Obstructive sleep apnea (adult) (pediatric): Secondary | ICD-10-CM

## 2019-03-03 NOTE — Telephone Encounter (Signed)
Spoke with patient. She is aware of results. She wishes to proceed with the cpap. Advised her of the process. Order has been placed. Recall for a follow up in 3 months has been placed.   Nothing further needed at time of call.

## 2019-03-03 NOTE — Telephone Encounter (Signed)
HST has been reviewed by Dr. Ander Slade. HST showed mild sleep apnea with an average of 11 events per hour. He recommends cpap therapy. Auto cpap 5-18cm.

## 2019-05-19 DIAGNOSIS — H02401 Unspecified ptosis of right eyelid: Secondary | ICD-10-CM | POA: Diagnosis not present

## 2019-05-19 DIAGNOSIS — H04123 Dry eye syndrome of bilateral lacrimal glands: Secondary | ICD-10-CM | POA: Diagnosis not present

## 2019-05-19 DIAGNOSIS — D4981 Neoplasm of unspecified behavior of retina and choroid: Secondary | ICD-10-CM | POA: Diagnosis not present

## 2019-05-19 DIAGNOSIS — H524 Presbyopia: Secondary | ICD-10-CM | POA: Diagnosis not present

## 2019-06-28 ENCOUNTER — Encounter: Payer: Self-pay | Admitting: Family Medicine

## 2019-06-30 ENCOUNTER — Encounter: Payer: Self-pay | Admitting: Family Medicine

## 2019-06-30 ENCOUNTER — Ambulatory Visit (INDEPENDENT_AMBULATORY_CARE_PROVIDER_SITE_OTHER): Payer: BC Managed Care – PPO | Admitting: Family Medicine

## 2019-06-30 VITALS — Ht 67.0 in | Wt 229.0 lb

## 2019-06-30 DIAGNOSIS — J329 Chronic sinusitis, unspecified: Secondary | ICD-10-CM

## 2019-06-30 DIAGNOSIS — U071 COVID-19: Secondary | ICD-10-CM

## 2019-06-30 DIAGNOSIS — B9689 Other specified bacterial agents as the cause of diseases classified elsewhere: Secondary | ICD-10-CM

## 2019-06-30 DIAGNOSIS — I10 Essential (primary) hypertension: Secondary | ICD-10-CM

## 2019-06-30 MED ORDER — AMOXICILLIN-POT CLAVULANATE 875-125 MG PO TABS: 1.0000 | ORAL_TABLET | Freq: Two times a day (BID) | ORAL | 0 refills | Status: AC

## 2019-06-30 NOTE — Patient Instructions (Addendum)
Health Maintenance Due  Topic Date Due  . INFLUENZA VACCINE Pt declined 01/15/2019    Recommended follow up: No follow-ups on file.

## 2019-06-30 NOTE — Progress Notes (Addendum)
Phone [phone] Virtual visit via Video note   Subjective:  Chief complaint: Chief Complaint  Patient presents with  . Fever    Positive Covid   This visit type was conducted due to national recommendations for restrictions regarding the COVID-19 Pandemic (e.g. social distancing).  This format is felt to be most appropriate for this patient at this time balancing risks to patient and risks to population by having him in for in person visit.  No physical exam was performed (except for noted visual exam or audio findings with Telehealth visits).    Our team/I connected with Erica Burke at  9:20 AM EST by a video enabled telemedicine application (doxy.me or caregility through epic) and verified that I am speaking with the correct person using two identifiers.  Location patient: Home[facility] Location provider: [facility], office Persons participating in the virtual visit:  patient  Our team/I discussed the limitations of evaluation and management by telemedicine and the availability of in person appointments. In light of current covid-19 pandemic, patient also understands that we are trying to protect them by minimizing in office contact if at all possible.  The patient expressed consent for telemedicine visit and agreed to proceed. Patient understands insurance will be billed.   Past Medical History-  Patient Active Problem List   Diagnosis Date Noted  . Osteoporosis 03/04/2017    Priority: High  . Hyperglycemia 12/01/2014    Priority: Medium  . Essential hypertension 10/19/2008    Priority: Medium  . Hyperlipidemia 01/25/2007    Priority: Medium  . Closed fracture of one rib of right side 06/24/2017    Priority: Low  . Closed fracture of right wrist 06/24/2017    Priority: Low  . Polyp of colon, adenomatous 02/06/2015    Priority: Low  . Obesity, Class II, BMI 35-39.9 04/19/2010    Priority: Low  . OSA on CPAP 07/11/2008    Priority: Low  . Morbid obesity ([place]) 06/30/2019     Medications- reviewed and updated Current Outpatient Medications  Medication Sig Dispense Refill  . fluticasone (FLONASE) 50 MCG/ACT nasal spray SPRAY 2 SPRAYS INTO EACH NOSTRIL EVERY DAY 48 g 3  . hydrochlorothiazide (HYDRODIURIL) 25 MG tablet Take 0.5 tablets (12.5 mg total) by mouth daily as needed (if blood pressure >140/90). 45 tablet 3  . Multiple Vitamin (MULTIVITAMIN) capsule Take 1 capsule by mouth daily.      Marland Kitchen olmesartan (BENICAR) 20 MG tablet Take 1 tablet (20 mg total) by mouth daily. 90 tablet 3  . amoxicillin-clavulanate (AUGMENTIN) 875-125 MG tablet Take 1 tablet by mouth 2 (two) times daily for 7 days. 14 tablet 0   No current facility-administered medications for this visit.     Objective:  Ht 5\' 7"  (1.702 m)   Wt 229 lb (103.9 kg)   BMI 35.87 kg/m  self reported vitals Gen: NAD, resting comfortably Lungs: nonlabored, normal respiratory rate  Skin: appears dry, no obvious rash    Assessment and Plan   # Positive Covid S: Positive test on January 4th.  By the 7th developed symptoms of head pressure, sinus congestion. she has severe sinus pressure and bloody mucus- mainly clear. Pt says that she has tried mucinex and alka setlzer for sinus- mucinex made her sleepy. She is now on Vitamin C and D as well. Day 8 of sinus symptoms- peaked Tuesday and yesterday- today slightly better. No loss of taste or smell.   was retiring from [employer] right before she got covid- within a week.  Multiple employees infected. She had been wearing n95 at work. Had first covid shot Monday and found out positive on Wednesday.   Has not used flonase recently as it was burning  By Tuesday had slight temperature elevation 99.8 but has not seen any elevated temps since that time.   Husband wayne sleeping in separate room A/P: Patient with testing confirming covid 19 with first day of covid 19 symptoms January 7th.  Patient also has severe sinus pressure and bloody discharge-had  worsening with slight temperature elevation and worsening sinus pressure a few days ago concerning for double sickening-we will cover with Augmentin in case this is a secondary bacterial infection   Therefore: - recommended patient watch closely for shortness of breath or confusion or worsening symptoms and if those occur she should contact us immediately or seek care in the emergency department -recommended patient consider purchasing pulse oximeter and if levels 94% or below persistently- seek care at the hospital - for quarantine if covid 19 needs to be at least 10 days since first symptom AND at least 24 hours fever free without fever reducing medications AND have improvement in respiratory symptoms - has some cough - earliest possible day out of quarantine January 17th, recommendations for patient- discussed 14 days more ideal - work note not needed- retiring - we also discussed close contacts would need 14 day quarantine after last close contact with patient  -If High risk for complications-we discussed potential for antibody treatment- patient declines outpatient antibody treatment-despite risk factors of age, morbid obesity with BMI over 35 and hypertension and OSA, and hypertension itself which is a risk factor   #hypertension S: compliant with hydrochlorothiazide 12.5 mg as needed for elevated blood pressure and olmesartan 20 mg BP Readings from Last 3 Encounters:  01/31/19 122/76  12/13/18 128/78  09/16/17 120/82  A/P: She did not have her cuff with her but she was going to find this and update me through my chart.  -I did ask her to keep eye on her blood pressure.  If she develops nausea or trouble eating with Covid-may be worse holding blood pressure medications as long as blood pressure remains controlled-we discussed potential risk for kidney strain  Lab/Order associations:   ICD-10-CM   1. COVID-19  U07.1   2. Bacterial sinusitis  J32.9    B96.89   3. Essential hypertension   I10   4. Morbid obesity ([place])  E66.01     Meds ordered this encounter  Medications  . amoxicillin-clavulanate (AUGMENTIN) 875-125 MG tablet    Sig: Take 1 tablet by mouth 2 (two) times daily for 7 days.    Dispense:  14 tablet    Refill:  0   Return precautions advised.  Garret Reddish, MD

## 2019-06-30 NOTE — Telephone Encounter (Signed)
Patient has been seen.

## 2019-07-27 ENCOUNTER — Telehealth: Payer: Self-pay | Admitting: Family Medicine

## 2019-07-27 NOTE — Progress Notes (Signed)
Team can we please follow-up on this-not sure if patient ever got the message about possible atorvastatin or Metformin.  May just be best to schedule follow-up visit-we saw her for virtual visit for Covid in January but need regular follow-up

## 2019-07-27 NOTE — Telephone Encounter (Signed)
LVM FOR PATIENT TO CALL BACK AND SCHEDULE A DIABETES F/U WITH DR. HUNTER.

## 2019-08-01 DIAGNOSIS — M9903 Segmental and somatic dysfunction of lumbar region: Secondary | ICD-10-CM | POA: Diagnosis not present

## 2019-08-01 DIAGNOSIS — M9902 Segmental and somatic dysfunction of thoracic region: Secondary | ICD-10-CM | POA: Diagnosis not present

## 2019-08-01 DIAGNOSIS — M5134 Other intervertebral disc degeneration, thoracic region: Secondary | ICD-10-CM | POA: Diagnosis not present

## 2019-08-01 DIAGNOSIS — M5136 Other intervertebral disc degeneration, lumbar region: Secondary | ICD-10-CM | POA: Diagnosis not present

## 2019-08-01 DIAGNOSIS — M9901 Segmental and somatic dysfunction of cervical region: Secondary | ICD-10-CM | POA: Diagnosis not present

## 2019-08-03 DIAGNOSIS — M9903 Segmental and somatic dysfunction of lumbar region: Secondary | ICD-10-CM | POA: Diagnosis not present

## 2019-08-03 DIAGNOSIS — M5136 Other intervertebral disc degeneration, lumbar region: Secondary | ICD-10-CM | POA: Diagnosis not present

## 2019-08-03 DIAGNOSIS — M9902 Segmental and somatic dysfunction of thoracic region: Secondary | ICD-10-CM | POA: Diagnosis not present

## 2019-08-03 DIAGNOSIS — M9901 Segmental and somatic dysfunction of cervical region: Secondary | ICD-10-CM | POA: Diagnosis not present

## 2019-08-03 DIAGNOSIS — M5134 Other intervertebral disc degeneration, thoracic region: Secondary | ICD-10-CM | POA: Diagnosis not present

## 2019-08-06 ENCOUNTER — Other Ambulatory Visit: Payer: Self-pay | Admitting: Family Medicine

## 2019-08-24 DIAGNOSIS — G4733 Obstructive sleep apnea (adult) (pediatric): Secondary | ICD-10-CM | POA: Diagnosis not present

## 2019-08-25 DIAGNOSIS — G4733 Obstructive sleep apnea (adult) (pediatric): Secondary | ICD-10-CM | POA: Diagnosis not present

## 2019-09-22 IMAGING — DX DG RIBS W/ CHEST 3+V*R*
3 series · 3 of 3 positions shown · non-contrast
Comparison: None.

CLINICAL DATA: Right rib pain after fall in the shower this
morning.

EXAM:
RIGHT RIBS AND CHEST - 3+ VIEW

[chest pa]
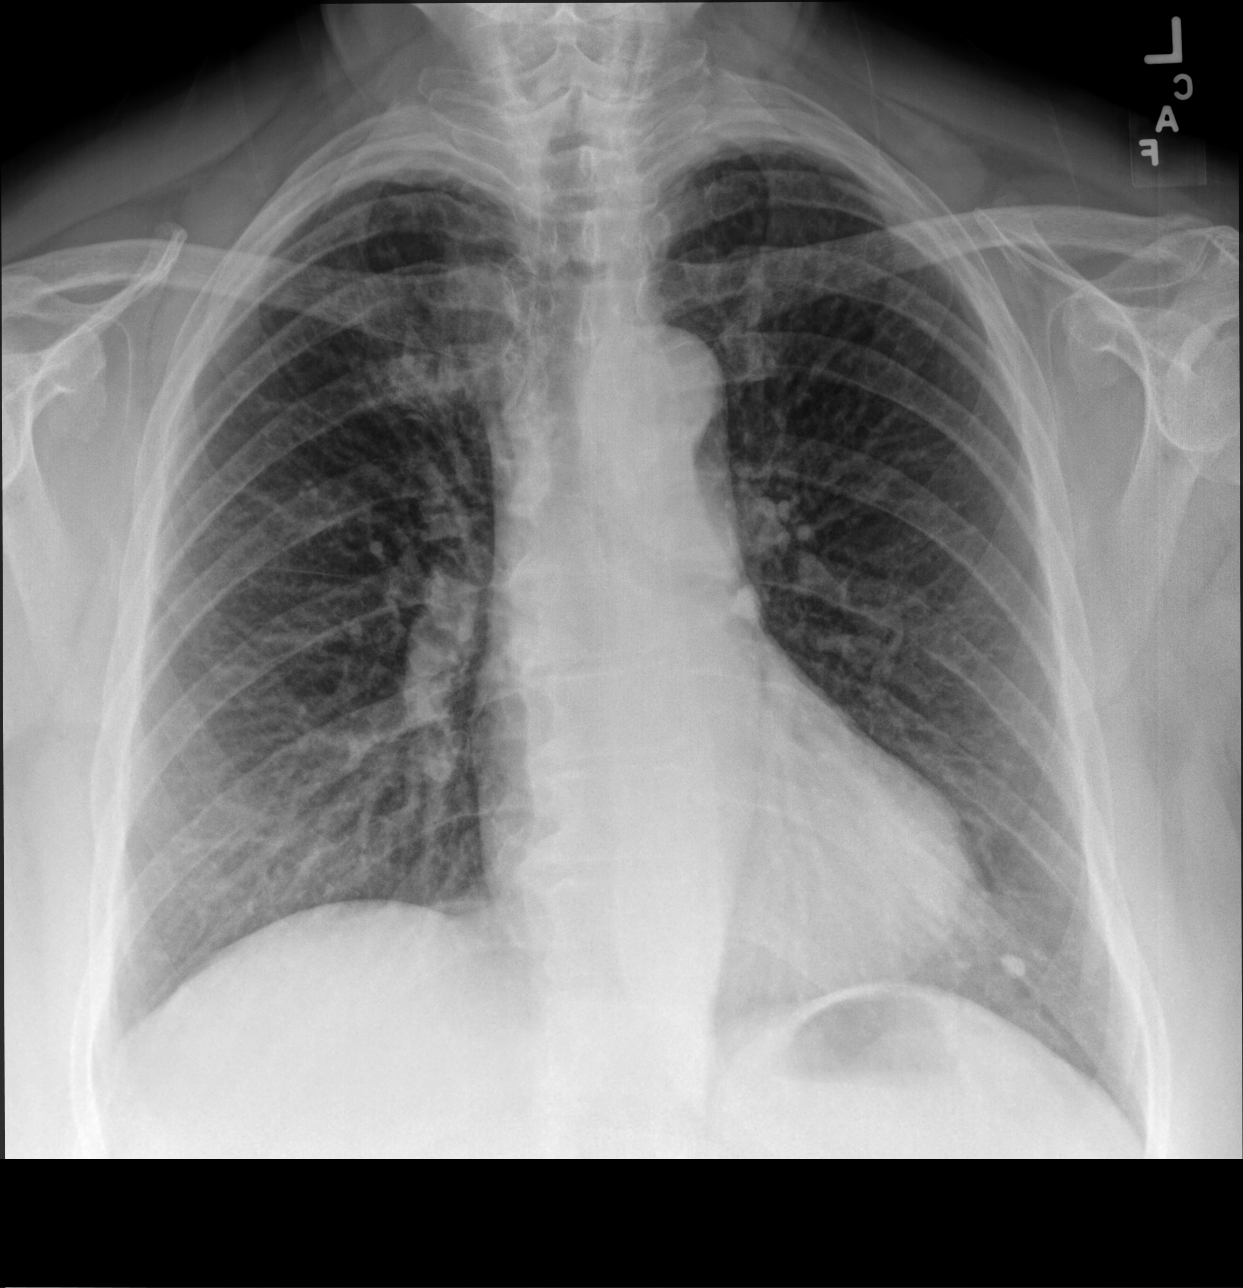

[oblique ribs oblique (1 of 2)]
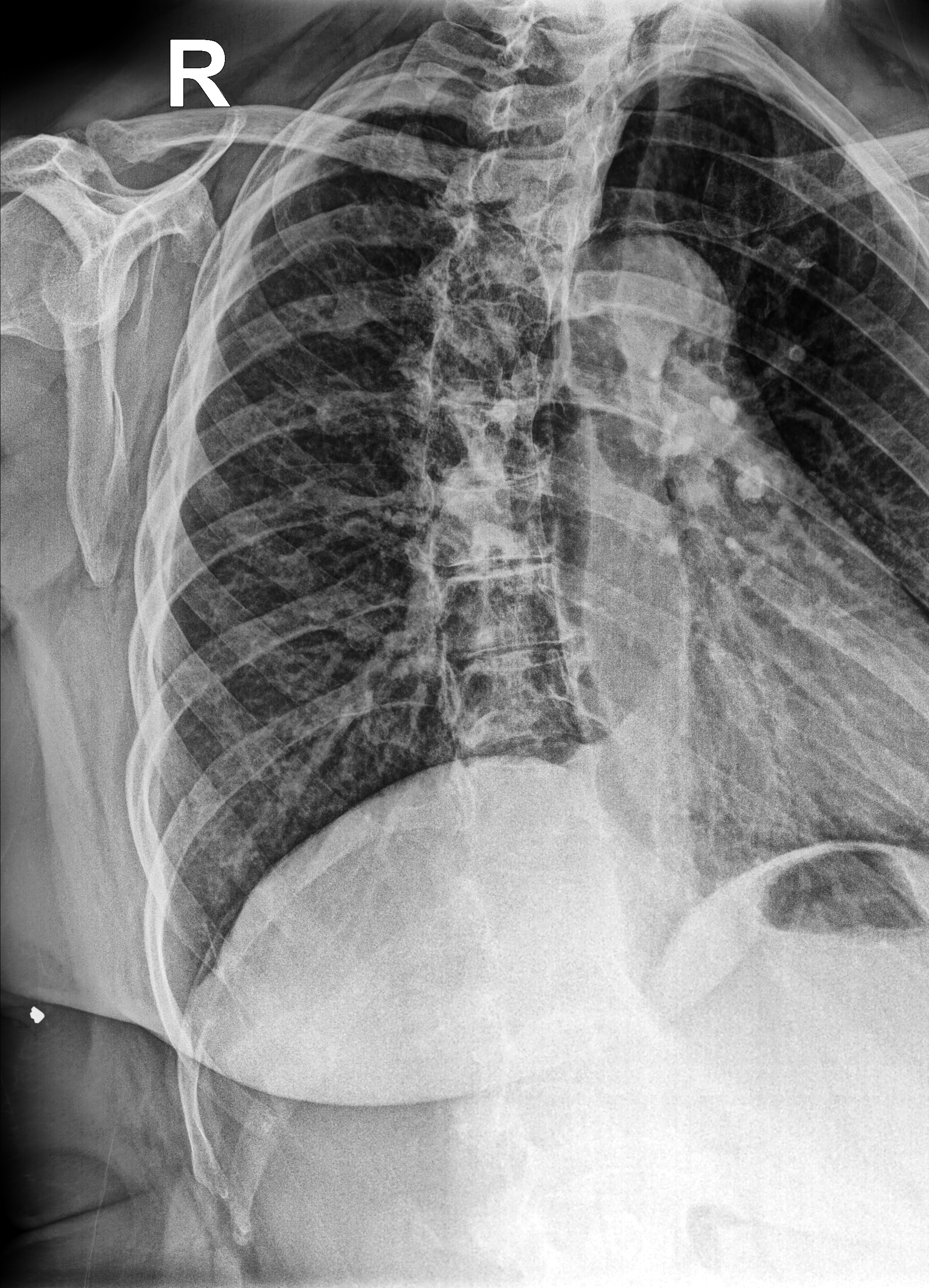

[oblique ribs oblique (2 of 2)]
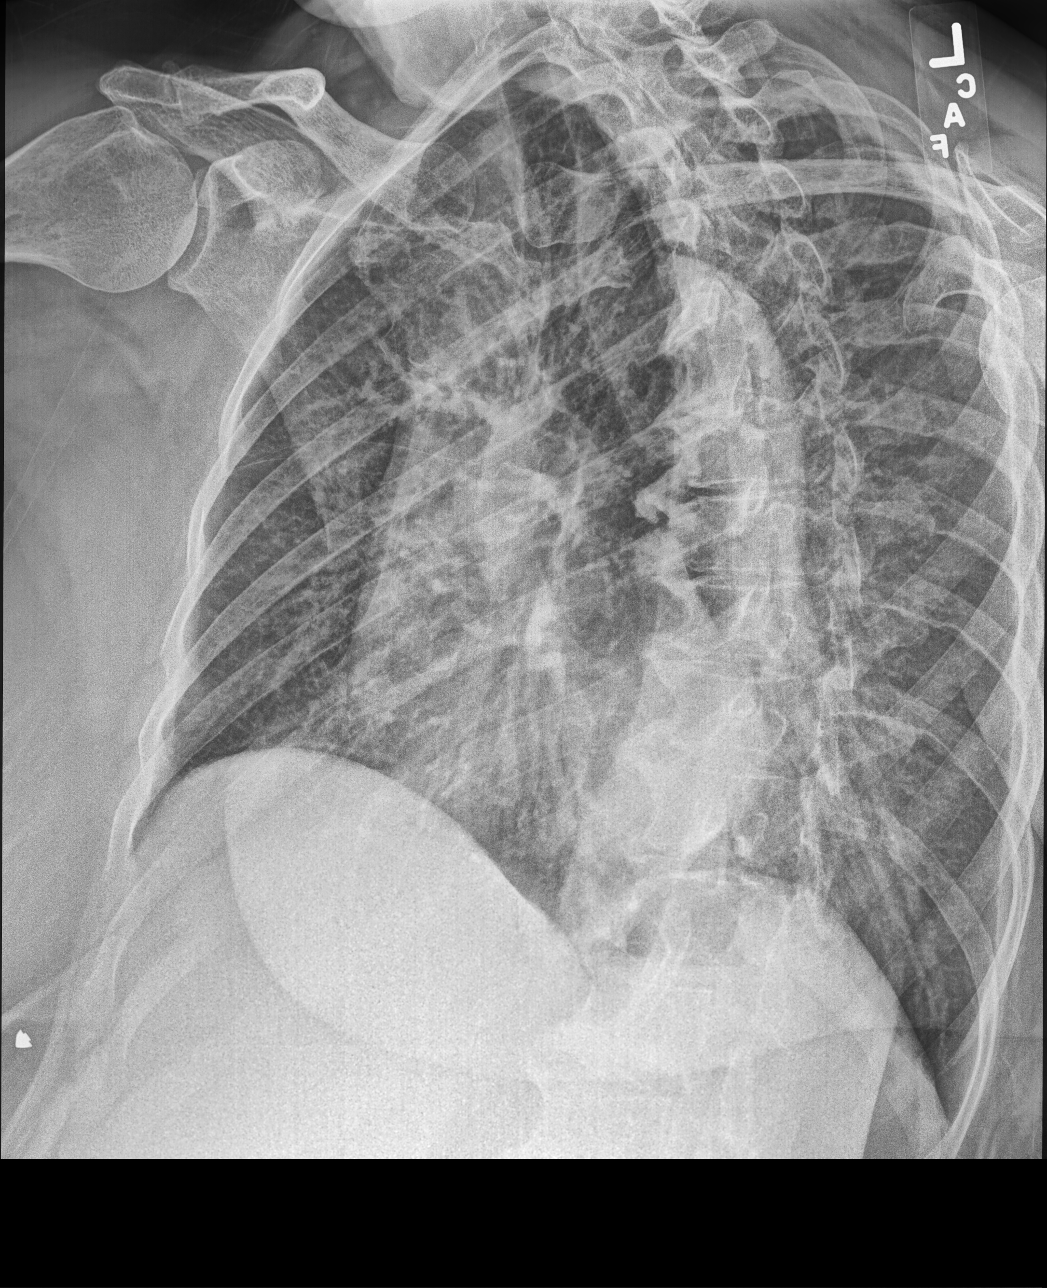

[3 of 3 positions shown; findings below may reference images not displayed]

FINDINGS: Nondisplaced fracture of the right posterolateral tenth rib. There
is no evidence of pneumothorax or pleural effusion. Both lungs are
clear. Calcified granuloma at the left lung base. Heart size and
mediastinal contours are within normal limits.
IMPRESSION: Nondisplaced fracture of the right posterolateral tenth rib. No
active cardiopulmonary disease.

## 2019-09-24 DIAGNOSIS — G4733 Obstructive sleep apnea (adult) (pediatric): Secondary | ICD-10-CM | POA: Diagnosis not present

## 2019-10-06 IMAGING — DX DG WRIST 2V*R*
2 series · 2 of 2 positions shown · non-contrast
Comparison: Right wrist films of 06/18/2017.

CLINICAL DATA: Fell 2 weeks ago with pain

EXAM:
RIGHT WRIST - 2 VIEW

[wrist pa]
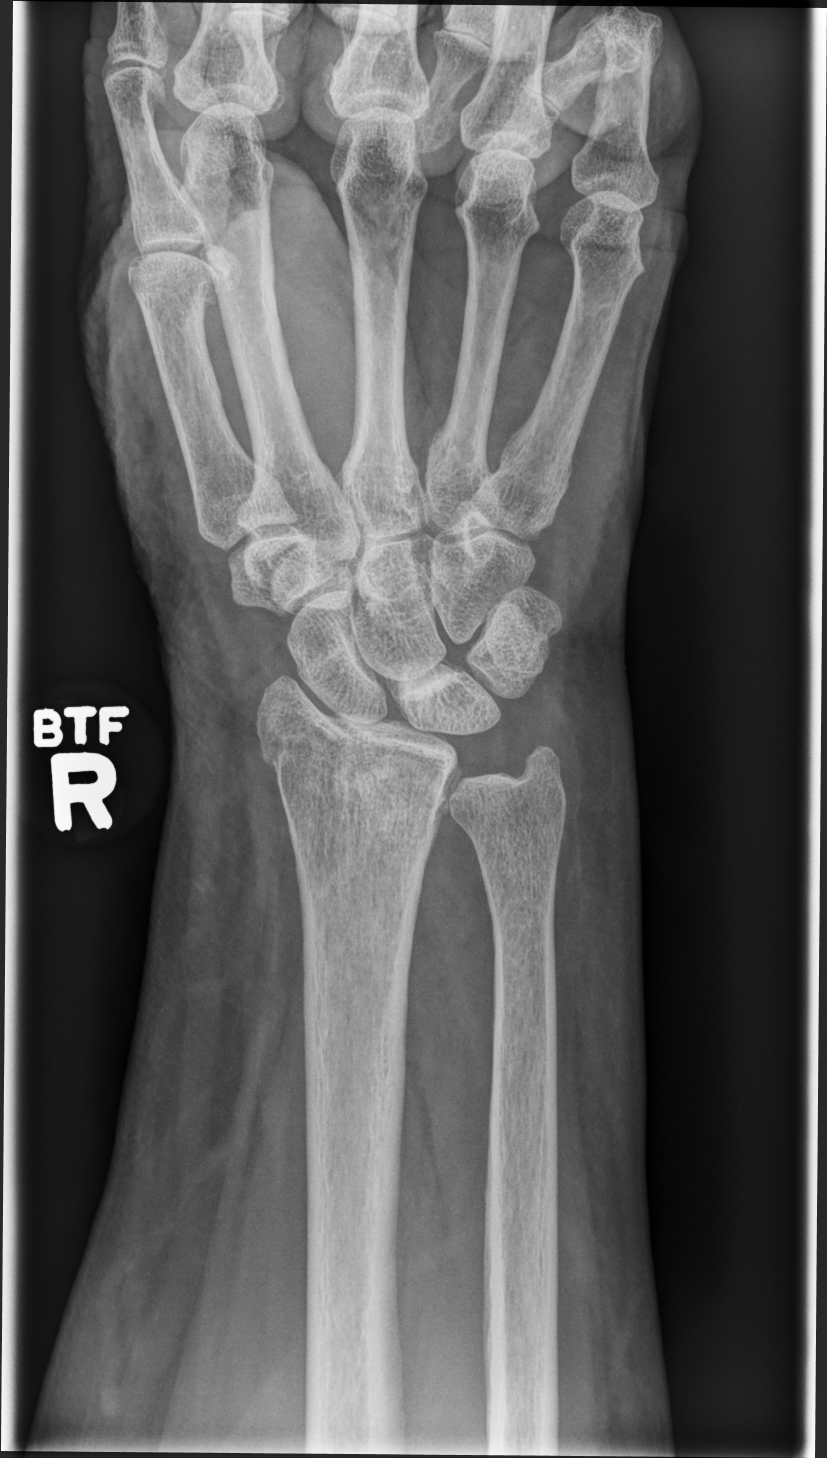

[wrist lat]
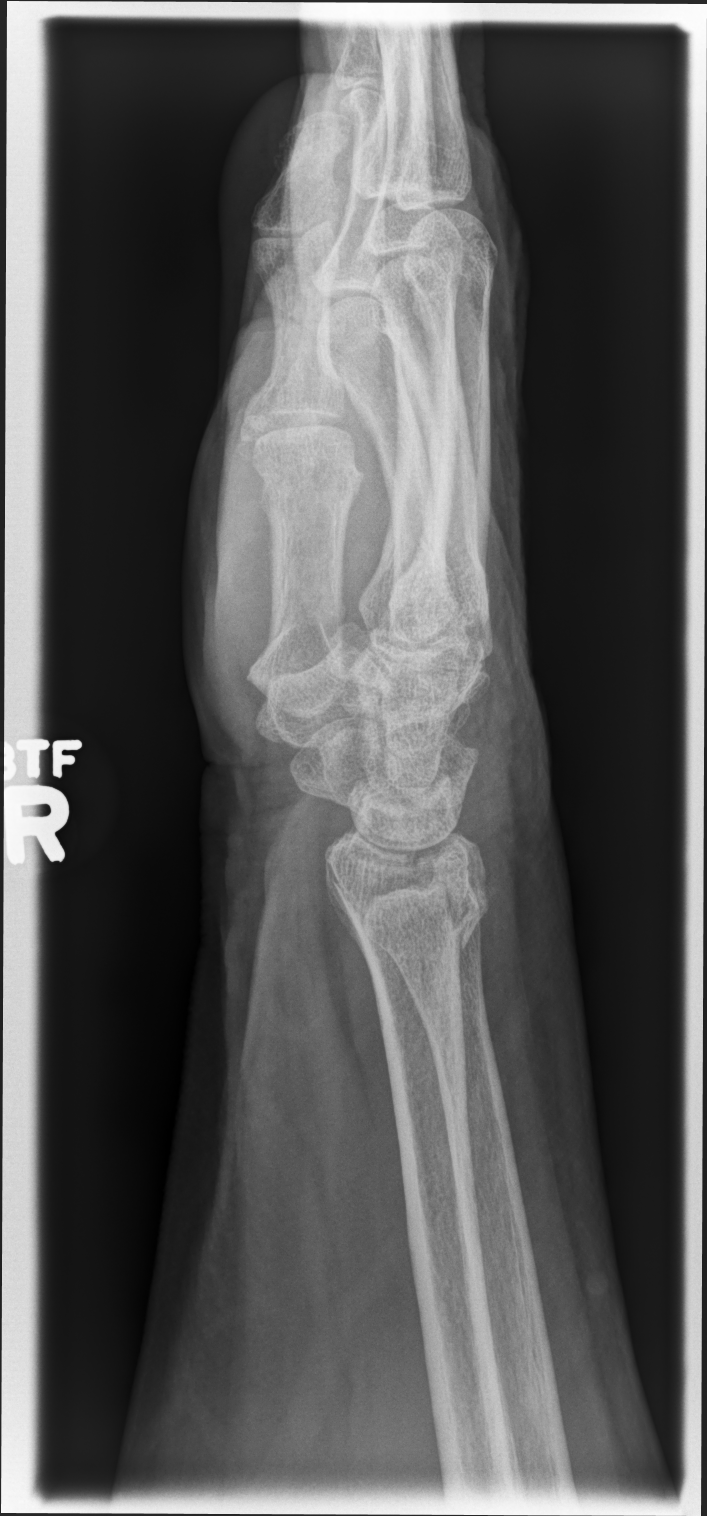

[2 of 2 positions shown; findings below may reference images not displayed]

FINDINGS: There is a nondisplaced fracture of the distal right radial styloid
which does extend intra-articular into the radiocarpal joint space.
The ulnar styloid is intact. The carpal bones are in normal position
with normal alignment maintained.
IMPRESSION: Nondisplaced right radial styloid fracture which extends
intra-articular.

## 2019-10-24 DIAGNOSIS — G4733 Obstructive sleep apnea (adult) (pediatric): Secondary | ICD-10-CM | POA: Diagnosis not present

## 2019-12-21 ENCOUNTER — Other Ambulatory Visit: Payer: Self-pay | Admitting: Family Medicine

## 2019-12-24 DIAGNOSIS — G4733 Obstructive sleep apnea (adult) (pediatric): Secondary | ICD-10-CM | POA: Diagnosis not present

## 2020-01-03 DIAGNOSIS — M5136 Other intervertebral disc degeneration, lumbar region: Secondary | ICD-10-CM | POA: Diagnosis not present

## 2020-01-03 DIAGNOSIS — M5134 Other intervertebral disc degeneration, thoracic region: Secondary | ICD-10-CM | POA: Diagnosis not present

## 2020-01-03 DIAGNOSIS — M9902 Segmental and somatic dysfunction of thoracic region: Secondary | ICD-10-CM | POA: Diagnosis not present

## 2020-01-03 DIAGNOSIS — M9903 Segmental and somatic dysfunction of lumbar region: Secondary | ICD-10-CM | POA: Diagnosis not present

## 2020-01-03 DIAGNOSIS — M9901 Segmental and somatic dysfunction of cervical region: Secondary | ICD-10-CM | POA: Diagnosis not present

## 2020-01-04 DIAGNOSIS — H02401 Unspecified ptosis of right eyelid: Secondary | ICD-10-CM | POA: Diagnosis not present

## 2020-01-04 DIAGNOSIS — D4981 Neoplasm of unspecified behavior of retina and choroid: Secondary | ICD-10-CM | POA: Diagnosis not present

## 2020-01-04 DIAGNOSIS — H04123 Dry eye syndrome of bilateral lacrimal glands: Secondary | ICD-10-CM | POA: Diagnosis not present

## 2020-01-09 ENCOUNTER — Other Ambulatory Visit: Payer: Self-pay

## 2020-01-09 ENCOUNTER — Ambulatory Visit: Payer: Medicare Other | Admitting: Pulmonary Disease

## 2020-01-09 ENCOUNTER — Encounter: Payer: Self-pay | Admitting: Pulmonary Disease

## 2020-01-09 VITALS — BP 122/74 | HR 59 | Temp 97.9°F | Ht 66.5 in | Wt 220.6 lb

## 2020-01-09 DIAGNOSIS — Z9989 Dependence on other enabling machines and devices: Secondary | ICD-10-CM

## 2020-01-09 DIAGNOSIS — M5136 Other intervertebral disc degeneration, lumbar region: Secondary | ICD-10-CM | POA: Diagnosis not present

## 2020-01-09 DIAGNOSIS — G4733 Obstructive sleep apnea (adult) (pediatric): Secondary | ICD-10-CM

## 2020-01-09 DIAGNOSIS — M5134 Other intervertebral disc degeneration, thoracic region: Secondary | ICD-10-CM | POA: Diagnosis not present

## 2020-01-09 DIAGNOSIS — M9901 Segmental and somatic dysfunction of cervical region: Secondary | ICD-10-CM | POA: Diagnosis not present

## 2020-01-09 DIAGNOSIS — M9903 Segmental and somatic dysfunction of lumbar region: Secondary | ICD-10-CM | POA: Diagnosis not present

## 2020-01-09 DIAGNOSIS — M9902 Segmental and somatic dysfunction of thoracic region: Secondary | ICD-10-CM | POA: Diagnosis not present

## 2020-01-09 NOTE — Patient Instructions (Signed)
Obstructive sleep apnea adequately treated with CPAP therapy  Continue using CPAP on a nightly basis  Graded exercise as tolerated  Call with significant concerns  Follow-up in 6 months

## 2020-01-09 NOTE — Progress Notes (Signed)
Erica Burke    [id number]    03/21/1946  Primary Care Physician:Hunter, Brayton Mars, MD  Referring Physician: Marin Olp, [address],  [place] [address]  Chief complaint:   Patient being seen for obstructive sleep apnea  HPI:  Diagnosed with obstructive sleep apnea over 20 years ago Has been using CPAP for the last 15 to 17 years Machine is dated, had a recent home sleep study showing mild obstructive sleep apnea Reinitiated on CPAP therapy CPAP settings of 5-18  She feels she continues to benefit from CPAP use No significant changes in her health recently Feels relatively well  No dryness of her mouth in the morning Feels well restored when waking up in the morning Stays active with no significant health issues  She will be retiring soon   Outpatient Encounter Medications as of 01/09/2020  Medication Sig  . fluticasone (FLONASE) 50 MCG/ACT nasal spray SPRAY 2 SPRAYS INTO EACH NOSTRIL EVERY DAY  . hydrochlorothiazide (HYDRODIURIL) 25 MG tablet Take 0.5 tablets (12.5 mg total) by mouth daily as needed (if blood pressure >140/90).  . Multiple Vitamin (MULTIVITAMIN) capsule Take 1 capsule by mouth daily.    Marland Kitchen olmesartan (BENICAR) 20 MG tablet TAKE 1 TABLET BY MOUTH EVERY DAY   No facility-administered encounter medications on file as of 01/09/2020.    Allergies as of 01/09/2020 - Review Complete 01/09/2020  Allergen Reaction Noted  . Tetanus-diphtheria toxoids td    . Codeine sulfate      Past Medical History:  Diagnosis Date  . Hyperlipidemia   . Hypertension   . Sleep apnea    uses cpap  . TRANSAMINASES, SERUM, ELEVATED 10/19/2008   Mild elevations - resolved      Past Surgical History:  Procedure Laterality Date  . ABDOMINAL HYSTERECTOMY     severe bleeding, still has ovaries  . cataract surgery     bilateral  . FLEXIBLE SIGMOIDOSCOPY  2001    Family History  Problem Relation Age of Onset  . Stroke Mother   . Stroke  Father   . Hypertension Other   . Lung cancer Brother        x2 smokers  . Scleroderma Brother        "died from thrombosis"  . Other Sister        spinal muscular atrophy- 1 sister, 2 brothers  . Colon cancer Neg Hx     Social History   Socioeconomic History  . Marital status: Single    Spouse name: Not on file  . Number of children: Not on file  . Years of education: Not on file  . Highest education level: Not on file  Occupational History  . Not on file  Tobacco Use  . Smoking status: Never Smoker  . Smokeless tobacco: Never Used  Substance and Sexual Activity  . Alcohol use: No    Alcohol/week: 0.0 standard drinks  . Drug use: No  . Sexual activity: Not on file  Other Topics Concern  . Not on file  Social History Narrative   Married (49 years in [facility] patient of Dr. Yong Channel). 3 children. 5 grandkids.       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 Difficulty of Paying Living Expenses:   Food Insecurity:   . Worried About  Running Out of Food in the Last Year:   . [place] in the Last Year:   Transportation Needs:   . Lack of Transportation (Medical):   Marland Kitchen Lack of Transportation (Non-Medical):   Physical Activity:   . Days of Exercise per Week:   . Minutes of Exercise per Session:   Stress:   . Feeling of Stress :   Social Connections:   . Frequency of Communication with Friends and Family:   . Frequency of Social Gatherings with Friends and Family:   . Attends Religious Services:   . Active Member of Clubs or Organizations:   . Attends Archivist Meetings:   Marland Kitchen Marital Status:   Intimate Partner Violence:   . Fear of Current or Ex-Partner:   . Emotionally Abused:   Marland Kitchen Physically Abused:   . Sexually Abused:     Review of Systems  Constitutional: Negative for fatigue.  Respiratory: Positive for  apnea. Negative for shortness of breath.   Psychiatric/Behavioral: Positive for sleep disturbance.    Vitals:   01/09/20 1428  BP: 122/74  Pulse: 59  Temp: 97.9 F (36.6 C)  SpO2: 98%     Physical Exam Constitutional:      Appearance: She is well-developed. She is obese.  HENT:     Head: Normocephalic and atraumatic.  Eyes:     General:        Right eye: No discharge.        Left eye: No discharge.  Neck:     Thyroid: No thyromegaly.     Trachea: No tracheal deviation.  Cardiovascular:     Rate and Rhythm: Normal rate and regular rhythm.  Pulmonary:     Effort: Pulmonary effort is normal. No respiratory distress.     Breath sounds: Normal breath sounds. No wheezing or rales.  Musculoskeletal:     Cervical back: Normal range of motion and neck supple.  Neurological:     Mental Status: She is alert.    CPAP compliance reveals 100% compliance Average use of about 8 hours Machine set between 5 and 18 Residual AHI of 0.2 95 percentile pressure of 11.7  Assessment:  Known obstructive sleep apnea -Continues to benefit from CPAP use -Has been machine not working well  Hypertension is controlled  General health feels good  Plan/Recommendations: Continue CPAP therapy at current settings  Graded exercise as tolerated  I will see her back in the office in 6 months   Sherrilyn Rist MD [facility] 01/09/2020, 2:36 PM  CC: Marin Olp, MD

## 2020-01-13 DIAGNOSIS — M9902 Segmental and somatic dysfunction of thoracic region: Secondary | ICD-10-CM | POA: Diagnosis not present

## 2020-01-13 DIAGNOSIS — M5136 Other intervertebral disc degeneration, lumbar region: Secondary | ICD-10-CM | POA: Diagnosis not present

## 2020-01-13 DIAGNOSIS — M9901 Segmental and somatic dysfunction of cervical region: Secondary | ICD-10-CM | POA: Diagnosis not present

## 2020-01-13 DIAGNOSIS — M5134 Other intervertebral disc degeneration, thoracic region: Secondary | ICD-10-CM | POA: Diagnosis not present

## 2020-01-13 DIAGNOSIS — M9903 Segmental and somatic dysfunction of lumbar region: Secondary | ICD-10-CM | POA: Diagnosis not present

## 2020-01-16 DIAGNOSIS — M9901 Segmental and somatic dysfunction of cervical region: Secondary | ICD-10-CM | POA: Diagnosis not present

## 2020-01-16 DIAGNOSIS — M5134 Other intervertebral disc degeneration, thoracic region: Secondary | ICD-10-CM | POA: Diagnosis not present

## 2020-01-16 DIAGNOSIS — M9902 Segmental and somatic dysfunction of thoracic region: Secondary | ICD-10-CM | POA: Diagnosis not present

## 2020-01-16 DIAGNOSIS — M9903 Segmental and somatic dysfunction of lumbar region: Secondary | ICD-10-CM | POA: Diagnosis not present

## 2020-01-16 DIAGNOSIS — M5136 Other intervertebral disc degeneration, lumbar region: Secondary | ICD-10-CM | POA: Diagnosis not present

## 2020-01-18 DIAGNOSIS — M9902 Segmental and somatic dysfunction of thoracic region: Secondary | ICD-10-CM | POA: Diagnosis not present

## 2020-01-18 DIAGNOSIS — M9903 Segmental and somatic dysfunction of lumbar region: Secondary | ICD-10-CM | POA: Diagnosis not present

## 2020-01-18 DIAGNOSIS — M5134 Other intervertebral disc degeneration, thoracic region: Secondary | ICD-10-CM | POA: Diagnosis not present

## 2020-01-18 DIAGNOSIS — M9901 Segmental and somatic dysfunction of cervical region: Secondary | ICD-10-CM | POA: Diagnosis not present

## 2020-01-18 DIAGNOSIS — M5136 Other intervertebral disc degeneration, lumbar region: Secondary | ICD-10-CM | POA: Diagnosis not present

## 2020-01-24 DIAGNOSIS — G4733 Obstructive sleep apnea (adult) (pediatric): Secondary | ICD-10-CM | POA: Diagnosis not present

## 2020-01-25 DIAGNOSIS — M5136 Other intervertebral disc degeneration, lumbar region: Secondary | ICD-10-CM | POA: Diagnosis not present

## 2020-01-25 DIAGNOSIS — M5134 Other intervertebral disc degeneration, thoracic region: Secondary | ICD-10-CM | POA: Diagnosis not present

## 2020-01-25 DIAGNOSIS — M9901 Segmental and somatic dysfunction of cervical region: Secondary | ICD-10-CM | POA: Diagnosis not present

## 2020-01-25 DIAGNOSIS — M9902 Segmental and somatic dysfunction of thoracic region: Secondary | ICD-10-CM | POA: Diagnosis not present

## 2020-01-25 DIAGNOSIS — M9903 Segmental and somatic dysfunction of lumbar region: Secondary | ICD-10-CM | POA: Diagnosis not present

## 2020-01-31 DIAGNOSIS — M5136 Other intervertebral disc degeneration, lumbar region: Secondary | ICD-10-CM | POA: Diagnosis not present

## 2020-01-31 DIAGNOSIS — M9901 Segmental and somatic dysfunction of cervical region: Secondary | ICD-10-CM | POA: Diagnosis not present

## 2020-01-31 DIAGNOSIS — M9902 Segmental and somatic dysfunction of thoracic region: Secondary | ICD-10-CM | POA: Diagnosis not present

## 2020-01-31 DIAGNOSIS — M5134 Other intervertebral disc degeneration, thoracic region: Secondary | ICD-10-CM | POA: Diagnosis not present

## 2020-01-31 DIAGNOSIS — M9903 Segmental and somatic dysfunction of lumbar region: Secondary | ICD-10-CM | POA: Diagnosis not present

## 2020-02-24 DIAGNOSIS — G4733 Obstructive sleep apnea (adult) (pediatric): Secondary | ICD-10-CM | POA: Diagnosis not present

## 2020-03-02 ENCOUNTER — Other Ambulatory Visit: Payer: Self-pay | Admitting: Family Medicine

## 2020-03-21 DIAGNOSIS — G4733 Obstructive sleep apnea (adult) (pediatric): Secondary | ICD-10-CM | POA: Diagnosis not present

## 2020-03-25 DIAGNOSIS — G4733 Obstructive sleep apnea (adult) (pediatric): Secondary | ICD-10-CM | POA: Diagnosis not present

## 2020-04-23 ENCOUNTER — Other Ambulatory Visit: Payer: Self-pay | Admitting: Family Medicine

## 2020-04-23 DIAGNOSIS — Z0000 Encounter for general adult medical examination without abnormal findings: Secondary | ICD-10-CM

## 2020-04-25 DIAGNOSIS — G4733 Obstructive sleep apnea (adult) (pediatric): Secondary | ICD-10-CM | POA: Diagnosis not present

## 2020-05-25 DIAGNOSIS — G4733 Obstructive sleep apnea (adult) (pediatric): Secondary | ICD-10-CM | POA: Diagnosis not present

## 2020-05-28 ENCOUNTER — Telehealth: Payer: Self-pay

## 2020-05-28 NOTE — Telephone Encounter (Signed)
Yes you can, Which day love ?

## 2020-05-28 NOTE — Telephone Encounter (Signed)
Pt states her blood pressure is getting low on the medication she is on. Can I use a same day for her to get seen?

## 2020-05-29 NOTE — Telephone Encounter (Signed)
Patient has been scheduled

## 2020-05-29 NOTE — Telephone Encounter (Signed)
LVM to schedule pt.

## 2020-05-29 NOTE — Patient Instructions (Addendum)
Health Maintenance Due  Topic Date Due  . INFLUENZA VACCINE Declined in office flu shot 01/15/2020  . COLONOSCOPY Scheduled for Feb 2022 01/31/2020  . COVID-19 Vaccine (3 - Booster for Moderna series) Has not received yet- still considering 02/17/2020   blood pressure appears overcontrolled with no highs and getting several lows. We will reduce olmesartan from 20mg  to 5 mg and asked her to update me in 2 weeks with home readings and if she is having any symptoms of low blood pressures (lightheaded, fatigue). Also removed hctz from list as has not had any readings abvoe 140/90 and was prn only. Seems like walking and weight loss have helped.   4-5 months physical but sooner if symptoms do not resolve with our adjustments plus please update me in 2 weeks  Can try voltaren gel/diclofenac gel for the knee

## 2020-05-29 NOTE — Progress Notes (Signed)
Phone [phone] In person visit   Subjective:   Erica Burke is a 74 y.o. year old very pleasant female patient who presents for/with See problem oriented charting Chief Complaint  Patient presents with  . Low Blood Pressure    This visit occurred during the SARS-CoV-2 public health emergency.  Safety protocols were in place, including screening questions prior to the visit, additional usage of staff PPE, and extensive cleaning of exam room while observing appropriate contact time as indicated for disinfecting solutions.   Past Medical History-  Patient Active Problem List   Diagnosis Date Noted  . Osteoporosis 03/04/2017    Priority: High  . Hyperglycemia 12/01/2014    Priority: Medium  . Essential hypertension 10/19/2008    Priority: Medium  . Hyperlipidemia 01/25/2007    Priority: Medium  . Closed fracture of one rib of right side 06/24/2017    Priority: Low  . Closed fracture of right wrist 06/24/2017    Priority: Low  . Polyp of colon, adenomatous 02/06/2015    Priority: Low  . Obesity, Class II, BMI 35-39.9 04/19/2010    Priority: Low  . OSA on CPAP 07/11/2008    Priority: Low    Medications- reviewed and updated Current Outpatient Medications  Medication Sig Dispense Refill  . fluticasone (FLONASE) 50 MCG/ACT nasal spray SPRAY 2 SPRAYS INTO EACH NOSTRIL EVERY DAY (Patient taking differently: as needed.) 48 mL 3  . Multiple Vitamin (MULTIVITAMIN) capsule Take 1 capsule by mouth daily.    Marland Kitchen olmesartan (BENICAR) 5 MG tablet Take 1 tablet (5 mg total) by mouth daily. 90 tablet 3   No current facility-administered medications for this visit.     Objective:  BP 128/76   Pulse 80   Temp (!) 97.5 F (36.4 C) (Temporal)   Resp 18   Ht 5\' 7"  (1.702 m)   Wt 214 lb 6.4 oz (97.3 kg)   SpO2 95%   BMI 33.58 kg/m  Gen: NAD, resting comfortably CV: RRR no murmurs rubs or gallops Lungs: CTAB no crackles, wheeze, rhonchi Ext: no edema Skin: warm, dry, lipoma 7-8 cm  x 7-8 cm on left shoulder    Assessment and Plan   #hypertension S: medication: Olmesartan 20 mg, hydrochlorothiazide 12.5 mg as needed for elevated blood pressure over 140-patient reports has not had to use  Home readings #s: this morning was 123/77 but by 7 30 she noted 116/66 and felt really tired. Getting normals and lows. Since September- has had more regular issues.   Weight down 15 lbs from January but she is walking 5 days a week and this is intentional.  BP Readings from Last 3 Encounters:  05/30/20 128/76  01/09/20 122/74  01/31/19 122/76  A/P: blood pressure appears overcontrolled with no highs and getting several lows. We will reduce olmesartan from 20mg  to 5 mg and asked her to update me in 2 weeks with home readings and if she is having any symptoms of low blood pressures (lightheaded, fatigue). Also removed hctz from list as has not had any readings abvoe 140/90 and was prn only. Seems like walking and weight loss have helped.    # Hyperglycemia/insulin resistance/prediabetes S:  Medication: Had sent a message about Metformin being started last July but does not appear she got this message Exercise and diet- down 15 lbs see above Lab Results  Component Value Date   HGBA1C 6.3 12/16/2018   HGBA1C 6.2 03/04/2017   HGBA1C 6.1 01/23/2015   A/P: hopefully stable- update a1c  with labs. Also with fatigue want to check cbc, cmp, tsh today  #Mild ALT elevation-we will update LFTs with labs today  #hyperlipidemia S: Medication:None Lab Results  Component Value Date   CHOL 199 12/16/2018   HDL 38.60 (L) 12/16/2018   LDLCALC 130 (H) 12/16/2018   LDLDIRECT 159.1 01/04/2010   TRIG 155.0 (H) 12/16/2018   CHOLHDL 5 12/16/2018   A/P: We discussed newer guidelines in fact that she is on the higher end of intermittent risk group (19.2% ascvd score) based off last year's labs-last year we have discussed possibly starting atorvastatin 20 mg once a week-we were unable to reach patient  it appears. She would prefer to work on lifestyle without meds. Update labs  #lipoma noted left shoulder- daughter noted thanksgiving but patient had never noted- not sure how long there but will monitor to make sure not growing or painful. Declines surgery consult for now  #scheduled for colonoscopy in February    Recommended follow up: Return in about 4 months (around 09/28/2020) for physical or sooner if needed. Future Appointments  Date Time Provider [place]  06/04/2020  1:50 PM [facility] MM 3 [facility]  07/17/2020 10:30 AM [facility] PREVISIT RM50 [facility]  07/24/2020 11:30 AM Irene Shipper, MD [facility]   Lab/Order associations: small amount of cranberry bread this AM   ICD-10-CM   1. Essential hypertension  I10 CBC With Differential/Platelet    COMPLETE METABOLIC PANEL WITH GFR  2. Hyperglycemia  R73.9 Hemoglobin A1c  3. Fatigue, unspecified type  R53.83 CBC With Differential/Platelet    COMPLETE METABOLIC PANEL WITH GFR    TSH  4. Hyperlipidemia, unspecified hyperlipidemia type  E78.5 Lipid Panel w/reflex Direct LDL   Meds ordered this encounter  Medications  . olmesartan (BENICAR) 5 MG tablet    Sig: Take 1 tablet (5 mg total) by mouth daily.    Dispense:  90 tablet    Refill:  3   Return precautions advised.  Garret Reddish, MD

## 2020-05-29 NOTE — Telephone Encounter (Signed)
Please schedule pt for tomorrow at 9:40

## 2020-05-30 ENCOUNTER — Ambulatory Visit (INDEPENDENT_AMBULATORY_CARE_PROVIDER_SITE_OTHER): Payer: Medicare Other | Admitting: Family Medicine

## 2020-05-30 ENCOUNTER — Other Ambulatory Visit: Payer: Self-pay

## 2020-05-30 ENCOUNTER — Encounter: Payer: Self-pay | Admitting: Family Medicine

## 2020-05-30 VITALS — BP 128/76 | HR 80 | Temp 97.5°F | Resp 18 | Ht 67.0 in | Wt 214.4 lb

## 2020-05-30 DIAGNOSIS — R5383 Other fatigue: Secondary | ICD-10-CM

## 2020-05-30 DIAGNOSIS — I10 Essential (primary) hypertension: Secondary | ICD-10-CM | POA: Diagnosis not present

## 2020-05-30 DIAGNOSIS — R739 Hyperglycemia, unspecified: Secondary | ICD-10-CM

## 2020-05-30 DIAGNOSIS — E785 Hyperlipidemia, unspecified: Secondary | ICD-10-CM

## 2020-05-30 MED ORDER — OLMESARTAN MEDOXOMIL 5 MG PO TABS: 5.0000 mg | ORAL_TABLET | Freq: Every day | ORAL | 3 refills | Status: DC

## 2020-05-31 ENCOUNTER — Other Ambulatory Visit: Payer: Self-pay | Admitting: Family Medicine

## 2020-05-31 LAB — LIPID PANEL W/REFLEX DIRECT LDL
Cholesterol: 233 mg/dL — ABNORMAL HIGH (ref <200)
HDL: 45 mg/dL — ABNORMAL LOW (ref >=50)
LDL Cholesterol (Calc): 156 mg/dL (calc) — ABNORMAL HIGH
Non-HDL Cholesterol (Calc): 188 mg/dL (calc) — ABNORMAL HIGH (ref <130)
Total CHOL/HDL Ratio: 5.2 (calc) — ABNORMAL HIGH (ref <5.0)
Triglycerides: 184 mg/dL — ABNORMAL HIGH (ref <150)

## 2020-05-31 LAB — COMPLETE METABOLIC PANEL WITH GFR
AG Ratio: 1.5 (calc) (ref 1.0–2.5)
ALT: 35 U/L — ABNORMAL HIGH (ref 6–29)
AST: 35 U/L (ref 10–35)
Albumin: 4.6 g/dL (ref 3.6–5.1)
Alkaline phosphatase (APISO): 58 U/L (ref 37–153)
BUN/Creatinine Ratio: 17 (calc) (ref 6–22)
BUN: 20 mg/dL (ref 7–25)
CO2: 30 mmol/L (ref 20–32)
Calcium: 10.5 mg/dL — ABNORMAL HIGH (ref 8.6–10.4)
Chloride: 100 mmol/L (ref 98–110)
Creat: 1.2 mg/dL — ABNORMAL HIGH (ref 0.60–0.93)
GFR, Est African American: 52 mL/min/{1.73_m2} — ABNORMAL LOW (ref >=60)
GFR, Est Non African American: 44 mL/min/{1.73_m2} — ABNORMAL LOW (ref >=60)
Globulin: 3.1 g/dL (calc) (ref 1.9–3.7)
Glucose, Bld: 108 mg/dL — ABNORMAL HIGH (ref 65–99)
Potassium: 4 mmol/L (ref 3.5–5.3)
Sodium: 139 mmol/L (ref 135–146)
Total Bilirubin: 0.6 mg/dL (ref 0.2–1.2)
Total Protein: 7.7 g/dL (ref 6.1–8.1)

## 2020-05-31 LAB — CBC WITH DIFFERENTIAL/PLATELET
Absolute Monocytes: 772 cells/uL (ref 200–950)
Basophils Absolute: 47 cells/uL (ref 0–200)
Basophils Relative: 0.6 %
Eosinophils Absolute: 133 cells/uL (ref 15–500)
Eosinophils Relative: 1.7 %
HCT: 40.2 % (ref 35.0–45.0)
Hemoglobin: 13.9 g/dL (ref 11.7–15.5)
Lymphs Abs: 2184 cells/uL (ref 850–3900)
MCH: 31.4 pg (ref 27.0–33.0)
MCHC: 34.6 g/dL (ref 32.0–36.0)
MCV: 90.7 fL (ref 80.0–100.0)
MPV: 10 fL (ref 7.5–12.5)
Monocytes Relative: 9.9 %
Neutro Abs: 4664 cells/uL (ref 1500–7800)
Neutrophils Relative %: 59.8 %
Platelets: 288 10*3/uL (ref 140–400)
RBC: 4.43 10*6/uL (ref 3.80–5.10)
RDW: 12.7 % (ref 11.0–15.0)
Total Lymphocyte: 28 %
WBC: 7.8 10*3/uL (ref 3.8–10.8)

## 2020-05-31 LAB — TSH: TSH: 1.78 mIU/L (ref 0.40–4.50)

## 2020-05-31 LAB — HEMOGLOBIN A1C
Hgb A1c MFr Bld: 6 % of total Hgb — ABNORMAL HIGH (ref <5.7)
Mean Plasma Glucose: 126 mg/dL
eAG (mmol/L): 7 mmol/L

## 2020-06-04 ENCOUNTER — Ambulatory Visit
Admission: RE | Admit: 2020-06-04 | Discharge: 2020-06-04 | Disposition: A | Discharge status: Home / Self Care | Payer: Medicare Other | Source: Ambulatory Visit | Attending: Family Medicine | Admitting: Family Medicine

## 2020-06-04 ENCOUNTER — Other Ambulatory Visit: Payer: Self-pay

## 2020-06-04 DIAGNOSIS — Z0000 Encounter for general adult medical examination without abnormal findings: Secondary | ICD-10-CM

## 2020-06-04 DIAGNOSIS — Z1231 Encounter for screening mammogram for malignant neoplasm of breast: Secondary | ICD-10-CM | POA: Diagnosis not present

## 2020-06-25 DIAGNOSIS — G4733 Obstructive sleep apnea (adult) (pediatric): Secondary | ICD-10-CM | POA: Diagnosis not present

## 2020-07-10 NOTE — Telephone Encounter (Signed)
Please advise on a contact number or something for this patient  Please have Billing Dept for Dr. Ander Slade call me at [phone] (home) or [phone] (cell. Thank you, Maxcine Ham

## 2020-07-17 ENCOUNTER — Ambulatory Visit (AMBULATORY_SURGERY_CENTER): Payer: Self-pay | Admitting: *Deleted

## 2020-07-17 ENCOUNTER — Other Ambulatory Visit: Payer: Self-pay

## 2020-07-17 VITALS — Ht 67.0 in | Wt 224.0 lb

## 2020-07-17 DIAGNOSIS — Z86010 Personal history of colonic polyps: Secondary | ICD-10-CM

## 2020-07-17 MED ORDER — SUPREP BOWEL PREP KIT 17.5-3.13-1.6 GM/177ML PO SOLN: 1.0000 | Freq: Once | ORAL | 0 refills | Status: AC

## 2020-07-17 NOTE — Progress Notes (Signed)

## 2020-07-18 ENCOUNTER — Encounter: Payer: Self-pay | Admitting: Internal Medicine

## 2020-07-24 ENCOUNTER — Ambulatory Visit (AMBULATORY_SURGERY_CENTER): Payer: Medicare Other | Admitting: Internal Medicine

## 2020-07-24 ENCOUNTER — Other Ambulatory Visit: Payer: Self-pay

## 2020-07-24 ENCOUNTER — Encounter: Payer: Self-pay | Admitting: Internal Medicine

## 2020-07-24 VITALS — BP 142/70 | HR 61 | Temp 97.8°F | Resp 12 | Ht 67.0 in | Wt 224.0 lb

## 2020-07-24 DIAGNOSIS — Z86010 Personal history of colonic polyps: Secondary | ICD-10-CM

## 2020-07-24 DIAGNOSIS — D128 Benign neoplasm of rectum: Secondary | ICD-10-CM | POA: Diagnosis not present

## 2020-07-24 HISTORY — PX: COLONOSCOPY: SHX174

## 2020-07-24 MED ORDER — SODIUM CHLORIDE 0.9 % IV SOLN: 500.0000 mL | Freq: Once | INTRAVENOUS | Status: DC

## 2020-07-24 NOTE — Progress Notes (Signed)
Report to PACU, RN, vss, BBS= Clear.  

## 2020-07-24 NOTE — Progress Notes (Signed)
Called to room to assist during endoscopic procedure.  Patient ID and intended procedure confirmed with present staff. Received instructions for my participation in the procedure from the performing physician.  

## 2020-07-24 NOTE — Patient Instructions (Signed)
Thank you for allowing [place] to care for you today!  Await final  pathology result of polyp removed, approximately 7-10 days by mail.  Resume previous diet and medications today.  Resume your normal daily activities tomorrow.  We will call you early Thursday morning to check on you,  Call us  sooner if any issues or concerns arise.    YOU HAD AN ENDOSCOPIC PROCEDURE TODAY AT [facility]:   Refer to the procedure report that was given to you for any specific questions about what was found during the examination.  If the procedure report does not answer your questions, please call your gastroenterologist to clarify.  If you requested that your care partner not be given the details of your procedure findings, then the procedure report has been included in a sealed envelope for you to review at your convenience later.  YOU SHOULD EXPECT: Some feelings of bloating in the abdomen. Passage of more gas than usual.  Walking can help get rid of the air that was put into your GI tract during the procedure and reduce the bloating. If you had a lower endoscopy (such as a colonoscopy or flexible sigmoidoscopy) you may notice spotting of blood in your stool or on the toilet paper. If you underwent a bowel prep for your procedure, you may not have a normal bowel movement for a few days.  Please Note:  You might notice some irritation and congestion in your nose or some drainage.  This is from the oxygen used during your procedure.  There is no need for concern and it should clear up in a day or so.  SYMPTOMS TO REPORT IMMEDIATELY:   Following lower endoscopy (colonoscopy or flexible sigmoidoscopy):  Excessive amounts of blood in the stool  Significant tenderness or worsening of abdominal pains  Swelling of the abdomen that is new, acute  Fever of 100F or higher   For urgent or emergent issues, a gastroenterologist can be reached at any hour by calling [phone]. Do not use MyChart  messaging for urgent concerns.    DIET:  We do recommend a small meal at first, but then you may proceed to your regular diet.  Drink plenty of fluids but you should avoid alcoholic beverages for 24 hours.  ACTIVITY:  You should plan to take it easy for the rest of today and you should NOT DRIVE or use heavy machinery until tomorrow (because of the sedation medicines used during the test).    FOLLOW UP: Our staff will call the number listed on your records 48-72 hours following your procedure to check on you and address any questions or concerns that you may have regarding the information given to you following your procedure. If we do not reach you, we will leave a message.  We will attempt to reach you two times.  During this call, we will ask if you have developed any symptoms of COVID 19. If you develop any symptoms (ie: fever, flu-like symptoms, shortness of breath, cough etc.) before then, please call [phone].  If you test positive for Covid 19 in the 2 weeks post procedure, please call and report this information to [place].    If any biopsies were taken you will be contacted by phone or by letter within the next 1-3 weeks.  Please call us at [phone] if you have not heard about the biopsies in 3 weeks.    SIGNATURES/CONFIDENTIALITY: You and/or your care partner have signed paperwork which will  be entered into your electronic medical record.  These signatures attest to the fact that that the information above on your After Visit Summary has been reviewed and is understood.  Full responsibility of the confidentiality of this discharge information lies with you and/or your care-partner. 

## 2020-07-24 NOTE — Progress Notes (Signed)
Pt's states no medical or surgical changes since previsit or office visit.  ° °Vitals CW °

## 2020-07-24 NOTE — Op Note (Signed)
[place] Patient Name: Erica Burke Procedure Date: 07/24/2020 11:59 AM MRN: [id number] Endoscopist: Docia Chuck. Henrene Pastor , MD Age: 75 Referring MD:  Date of Birth: 1946-03-21 Gender: Female Account #: [id number] Procedure:                Colonoscopy with cold snare polypectomy x 1 Indications:              High risk colon cancer surveillance: Personal                            history of non-advanced adenoma (2016) Medicines:                Monitored Anesthesia Care Procedure:                Pre-Anesthesia Assessment:                           - Prior to the procedure, a History and Physical                            was performed, and patient medications and                            allergies were reviewed. The patient's tolerance of                            previous anesthesia was also reviewed. The risks                            and benefits of the procedure and the sedation                            options and risks were discussed with the patient.                            All questions were answered, and informed consent                            was obtained. Prior Anticoagulants: The patient has                            taken no previous anticoagulant or antiplatelet                            agents. ASA Grade Assessment: II - A patient with                            mild systemic disease. After reviewing the risks                            and benefits, the patient was deemed in                            satisfactory condition to undergo the procedure.  After obtaining informed consent, the colonoscope                            was passed under direct vision. Throughout the                            procedure, the patient's blood pressure, pulse, and                            oxygen saturations were monitored continuously. The                            Olympus CF-HQ190L [id number]) Colonoscope was                             introduced through the anus and advanced to the the                            cecum, identified by appendiceal orifice and                            ileocecal valve. The ileocecal valve, appendiceal                            orifice, and rectum were photographed. The quality                            of the bowel preparation was excellent. The                            colonoscopy was performed without difficulty. The                            patient tolerated the procedure well. The bowel                            preparation used was SUPREP via split dose                            instruction. Scope In: 12:10:12 PM Scope Out: 12:26:00 PM Scope Withdrawal Time: 0 hours 11 minutes 56 seconds  Total Procedure Duration: 0 hours 15 minutes 48 seconds  Findings:                 A 5 mm polyp was found in the rectum. The polyp was                            pedunculated. The polyp was removed with a cold                            snare. Resection and retrieval were complete.                           Many diverticula were found in the sigmoid colon.  The exam was otherwise without abnormality on                            direct and retroflexion views. Complications:            No immediate complications. Estimated blood loss:                            None. Estimated Blood Loss:     Estimated blood loss: none. Impression:               - One 5 mm polyp in the rectum, removed with a cold                            snare. Resected and retrieved.                           - Diverticulosis in the sigmoid colon.                           - The examination was otherwise normal on direct                            and retroflexion views. Recommendation:           - Repeat colonoscopy is not recommended for                            surveillance.                           - Patient has a contact number available for                            emergencies. The  signs and symptoms of potential                            delayed complications were discussed with the                            patient. Return to normal activities tomorrow.                            Written discharge instructions were provided to the                            patient.                           - Resume previous diet.                           - Continue present medications.                           - Await pathology results. Docia Chuck. Henrene Pastor, MD 07/24/2020 12:31:54 PM This report has been signed electronically.

## 2020-07-26 ENCOUNTER — Telehealth: Payer: Self-pay

## 2020-07-26 DIAGNOSIS — G4733 Obstructive sleep apnea (adult) (pediatric): Secondary | ICD-10-CM | POA: Diagnosis not present

## 2020-07-26 NOTE — Telephone Encounter (Signed)
  Follow up Call-  Call back number 07/24/2020  Post procedure Call Back phone  # [phone]  Permission to leave phone message Yes  Some recent data might be hidden     Patient questions:  Do you have a fever, pain , or abdominal swelling? No. Pain Score  0 *  Have you tolerated food without any problems? Yes.    Have you been able to return to your normal activities? Yes.    Do you have any questions about your discharge instructions: Diet   No. Medications  No. Follow up visit  No.  Do you have questions or concerns about your Care? No.  Actions: * If pain score is 4 or above: No action needed, pain <4.  1. Have you developed a fever since your procedure? no  2.   Have you had an respiratory symptoms (SOB or cough) since your procedure? no  3.   Have you tested positive for COVID 19 since your procedure no  4.   Have you had any family members/close contacts diagnosed with the COVID 19 since your procedure?  no   If yes to any of these questions please route to Joylene John, RN and Joella Prince, RN

## 2020-08-01 ENCOUNTER — Encounter: Payer: Self-pay | Admitting: Internal Medicine

## 2020-08-23 DIAGNOSIS — G4733 Obstructive sleep apnea (adult) (pediatric): Secondary | ICD-10-CM | POA: Diagnosis not present

## 2020-09-11 ENCOUNTER — Encounter: Payer: Self-pay | Admitting: Family Medicine

## 2020-09-11 NOTE — Telephone Encounter (Signed)
Called patient and reschedule her appt.

## 2020-10-08 DIAGNOSIS — G4733 Obstructive sleep apnea (adult) (pediatric): Secondary | ICD-10-CM | POA: Diagnosis not present

## 2020-10-12 ENCOUNTER — Encounter: Payer: Medicare Other | Admitting: Family Medicine

## 2020-10-20 ENCOUNTER — Other Ambulatory Visit: Payer: Self-pay | Admitting: Family Medicine

## 2020-10-25 ENCOUNTER — Encounter: Payer: Self-pay | Admitting: Family Medicine

## 2020-10-26 ENCOUNTER — Other Ambulatory Visit: Payer: Self-pay

## 2020-10-26 MED ORDER — OLMESARTAN MEDOXOMIL 5 MG PO TABS: 5.0000 mg | ORAL_TABLET | Freq: Every day | ORAL | 3 refills | Status: DC

## 2020-11-16 ENCOUNTER — Encounter: Payer: Medicare Other | Admitting: Family Medicine

## 2021-01-16 DIAGNOSIS — D3131 Benign neoplasm of right choroid: Secondary | ICD-10-CM | POA: Diagnosis not present

## 2021-01-16 DIAGNOSIS — H3561 Retinal hemorrhage, right eye: Secondary | ICD-10-CM | POA: Diagnosis not present

## 2021-01-16 DIAGNOSIS — H43813 Vitreous degeneration, bilateral: Secondary | ICD-10-CM | POA: Diagnosis not present

## 2021-01-16 DIAGNOSIS — H04123 Dry eye syndrome of bilateral lacrimal glands: Secondary | ICD-10-CM | POA: Diagnosis not present

## 2021-01-17 ENCOUNTER — Other Ambulatory Visit: Payer: Self-pay

## 2021-01-17 ENCOUNTER — Encounter: Payer: Self-pay | Admitting: Pulmonary Disease

## 2021-01-17 ENCOUNTER — Ambulatory Visit: Payer: Medicare Other | Admitting: Pulmonary Disease

## 2021-01-17 VITALS — BP 118/74 | HR 70 | Temp 97.8°F | Ht 66.0 in | Wt 224.0 lb

## 2021-01-17 DIAGNOSIS — Z9989 Dependence on other enabling machines and devices: Secondary | ICD-10-CM | POA: Diagnosis not present

## 2021-01-17 DIAGNOSIS — G4733 Obstructive sleep apnea (adult) (pediatric): Secondary | ICD-10-CM

## 2021-01-17 NOTE — Progress Notes (Signed)
Erica Burke    [id number]    09/29/1945  Primary Care Physician:Erica Burke  Referring Physician: Marin Olp, [address] [place],  [place] [id number]  Chief complaint:   Patient being seen for obstructive sleep apnea  HPI:  Has been using CPAP for over 15 to 17 years Diagnosed about 20 years ago  She is on auto titrating CPAP set between 5 and 18 Continues to benefit from using CPAP Sleeps well, functions well No significant health problems  Occasionally does have dryness of the mouth in the mornings  She does think she may need a new mask  Recently retired  Outpatient Encounter Medications as of 01/17/2021  Medication Sig   fluticasone (FLONASE) 50 MCG/ACT nasal spray SPRAY 2 SPRAYS INTO EACH NOSTRIL EVERY DAY   Multiple Vitamin (MULTIVITAMIN) capsule Take 1 capsule by mouth daily.   olmesartan (BENICAR) 5 MG tablet Take 1 tablet (5 mg total) by mouth daily.   No facility-administered encounter medications on file as of 01/17/2021.    Allergies as of 01/17/2021 - Review Complete 01/17/2021  Allergen Reaction Noted   Tetanus-diphtheria toxoids td     Tetanus-diphtheria toxoids td  08/21/2020   Codeine sulfate      Past Medical History:  Diagnosis Date   Allergy    mild    Anemia    past hx - 20's early 30's   Cataract    removed bilat   Hyperlipidemia    Hypertension    Sleep apnea    uses cpap   Thyroid disease    in her 20's- short period    TRANSAMINASES, SERUM, ELEVATED 10/19/2008   Mild elevations - resolved      Past Surgical History:  Procedure Laterality Date   ABDOMINAL HYSTERECTOMY     severe bleeding, still has ovaries   cataract surgery     bilateral   COLONOSCOPY  07/24/2020   01/31/15- Erica Burke   FLEXIBLE SIGMOIDOSCOPY  2001   POLYPECTOMY     SIGMOIDOSCOPY      Family History  Problem Relation Age of Onset   Stroke Mother    Stroke Father    Hypertension Other    Lung cancer Brother        x2  smokers   Scleroderma Brother        "died from thrombosis"   Other Sister        spinal muscular atrophy- 1 sister, 2 brothers   Colon cancer Neg Hx    Colon polyps Neg Hx    Esophageal cancer Neg Hx    Rectal cancer Neg Hx    Stomach cancer Neg Hx     Social History   Socioeconomic History   Marital status: Married    Spouse name: Not on file   Number of children: Not on file   Years of education: Not on file   Highest education level: Not on file  Occupational History   Not on file  Tobacco Use   Smoking status: Never   Smokeless tobacco: Never  Substance and Sexual Activity   Alcohol use: No    Alcohol/week: 0.0 standard drinks   Drug use: No   Sexual activity: Not on file  Other Topics Concern   Not on file  Social History Narrative   Married (72 years in 2016- [place] patient of Dr. Yong Burke). 3 children. 5 grandkids.       Works at [employer].  Does staffing/payroll/interviewing      Hobbies: time with grandkids, [place]   Social Determinants of Health   Financial Resource Strain: Not on file  Food Insecurity: Not on file  Transportation Needs: Not on file  Physical Activity: Not on file  Stress: Not on file  Social Connections: Not on file  Intimate Partner Violence: Not on file    Review of Systems  Constitutional:  Negative for fatigue.  Respiratory:  Positive for apnea. Negative for shortness of breath.   Psychiatric/Behavioral:  Positive for sleep disturbance.    Vitals:   01/17/21 0922  BP: 118/74  Pulse: 70  Temp: 97.8 F (36.6 C)  SpO2: 97%     Physical Exam Constitutional:      Appearance: She is well-developed. She is obese.  HENT:     Head: Normocephalic and atraumatic.  Eyes:     General:        Right eye: No discharge.        Left eye: No discharge.  Neck:     Thyroid: No thyromegaly.     Trachea: No tracheal deviation.  Cardiovascular:     Rate and Rhythm: Normal rate and regular rhythm.  Pulmonary:     Effort:  Pulmonary effort is normal. No respiratory distress.     Breath sounds: Normal breath sounds. No stridor. No wheezing or rhonchi.  Musculoskeletal:     Cervical back: Normal range of motion and neck supple.  Neurological:     Mental Status: She is alert.  Psychiatric:        Mood and Affect: Mood normal.   CPAP compliance reveals 100% compliance Average use of 7 hours 57 minutes Set between 5 and 18 Residual AHI of 0.2  Assessment:  Known obstructive sleep apnea -Continues to benefit from CPAP use -May need a mask change  Good general health  Hypertension is better controlled  Plan/Recommendations: Continue CPAP at current settings  Graded exercise as tolerated  Follow-up in a year   Erica Rist [facility] 01/17/2021, 9:35 AM  CC: Erica Olp, [place]

## 2021-01-17 NOTE — Patient Instructions (Signed)
Order to DME for CPAP supplies  I will see you back in 1 year  Call with significant concerns or questions  Continue using CPAP on a nightly basis

## 2021-01-30 DIAGNOSIS — M9903 Segmental and somatic dysfunction of lumbar region: Secondary | ICD-10-CM | POA: Diagnosis not present

## 2021-01-30 DIAGNOSIS — M9902 Segmental and somatic dysfunction of thoracic region: Secondary | ICD-10-CM | POA: Diagnosis not present

## 2021-01-30 DIAGNOSIS — M5134 Other intervertebral disc degeneration, thoracic region: Secondary | ICD-10-CM | POA: Diagnosis not present

## 2021-01-30 DIAGNOSIS — M5136 Other intervertebral disc degeneration, lumbar region: Secondary | ICD-10-CM | POA: Diagnosis not present

## 2021-01-30 DIAGNOSIS — M9901 Segmental and somatic dysfunction of cervical region: Secondary | ICD-10-CM | POA: Diagnosis not present

## 2021-01-30 DIAGNOSIS — M5032 Other cervical disc degeneration, mid-cervical region, unspecified level: Secondary | ICD-10-CM | POA: Diagnosis not present

## 2021-02-27 DIAGNOSIS — M5136 Other intervertebral disc degeneration, lumbar region: Secondary | ICD-10-CM | POA: Diagnosis not present

## 2021-02-27 DIAGNOSIS — M9903 Segmental and somatic dysfunction of lumbar region: Secondary | ICD-10-CM | POA: Diagnosis not present

## 2021-02-27 DIAGNOSIS — M9901 Segmental and somatic dysfunction of cervical region: Secondary | ICD-10-CM | POA: Diagnosis not present

## 2021-02-27 DIAGNOSIS — M9902 Segmental and somatic dysfunction of thoracic region: Secondary | ICD-10-CM | POA: Diagnosis not present

## 2021-02-27 DIAGNOSIS — M5032 Other cervical disc degeneration, mid-cervical region, unspecified level: Secondary | ICD-10-CM | POA: Diagnosis not present

## 2021-02-27 DIAGNOSIS — M5134 Other intervertebral disc degeneration, thoracic region: Secondary | ICD-10-CM | POA: Diagnosis not present

## 2021-03-04 DIAGNOSIS — M9903 Segmental and somatic dysfunction of lumbar region: Secondary | ICD-10-CM | POA: Diagnosis not present

## 2021-03-04 DIAGNOSIS — M5134 Other intervertebral disc degeneration, thoracic region: Secondary | ICD-10-CM | POA: Diagnosis not present

## 2021-03-04 DIAGNOSIS — M9902 Segmental and somatic dysfunction of thoracic region: Secondary | ICD-10-CM | POA: Diagnosis not present

## 2021-03-04 DIAGNOSIS — M9901 Segmental and somatic dysfunction of cervical region: Secondary | ICD-10-CM | POA: Diagnosis not present

## 2021-03-04 DIAGNOSIS — M5136 Other intervertebral disc degeneration, lumbar region: Secondary | ICD-10-CM | POA: Diagnosis not present

## 2021-03-04 DIAGNOSIS — M5032 Other cervical disc degeneration, mid-cervical region, unspecified level: Secondary | ICD-10-CM | POA: Diagnosis not present

## 2021-03-11 DIAGNOSIS — M9901 Segmental and somatic dysfunction of cervical region: Secondary | ICD-10-CM | POA: Diagnosis not present

## 2021-03-11 DIAGNOSIS — M9902 Segmental and somatic dysfunction of thoracic region: Secondary | ICD-10-CM | POA: Diagnosis not present

## 2021-03-11 DIAGNOSIS — M9903 Segmental and somatic dysfunction of lumbar region: Secondary | ICD-10-CM | POA: Diagnosis not present

## 2021-03-11 DIAGNOSIS — M5134 Other intervertebral disc degeneration, thoracic region: Secondary | ICD-10-CM | POA: Diagnosis not present

## 2021-03-11 DIAGNOSIS — M5032 Other cervical disc degeneration, mid-cervical region, unspecified level: Secondary | ICD-10-CM | POA: Diagnosis not present

## 2021-03-11 DIAGNOSIS — M5136 Other intervertebral disc degeneration, lumbar region: Secondary | ICD-10-CM | POA: Diagnosis not present

## 2021-03-18 DIAGNOSIS — M9903 Segmental and somatic dysfunction of lumbar region: Secondary | ICD-10-CM | POA: Diagnosis not present

## 2021-03-18 DIAGNOSIS — M9901 Segmental and somatic dysfunction of cervical region: Secondary | ICD-10-CM | POA: Diagnosis not present

## 2021-03-18 DIAGNOSIS — M9902 Segmental and somatic dysfunction of thoracic region: Secondary | ICD-10-CM | POA: Diagnosis not present

## 2021-03-18 DIAGNOSIS — M5032 Other cervical disc degeneration, mid-cervical region, unspecified level: Secondary | ICD-10-CM | POA: Diagnosis not present

## 2021-03-18 DIAGNOSIS — M5136 Other intervertebral disc degeneration, lumbar region: Secondary | ICD-10-CM | POA: Diagnosis not present

## 2021-03-18 DIAGNOSIS — M5134 Other intervertebral disc degeneration, thoracic region: Secondary | ICD-10-CM | POA: Diagnosis not present

## 2021-03-18 NOTE — Progress Notes (Signed)
Phone [phone]   Subjective:  Patient presents today for their annual physical. Chief complaint-noted.   See problem oriented charting- ROS- full  review of systems was completed and negative except for: seasonal sinus pressure, dry eyes stable, rare palpitations perhaps once a month but very brief with no chest pain or shortness of breath, occasional leg swelling, occasional muscle aches, allergies  The following were reviewed and entered/updated in epic: Past Medical History:  Diagnosis Date   Allergy    mild    Anemia    past hx - 20's early 30's   Cataract    removed bilat   Hyperlipidemia    Hypertension    Sleep apnea    uses cpap   Thyroid disease    in her 20's- short period    TRANSAMINASES, SERUM, ELEVATED 10/19/2008   Mild elevations - resolved     Patient Active Problem List   Diagnosis Date Noted   Osteoporosis 03/04/2017    Priority: 1.   Hyperglycemia 12/01/2014    Priority: 2.   Essential hypertension 10/19/2008    Priority: 2.   Hyperlipidemia 01/25/2007    Priority: 2.   Closed fracture of one rib of right side 06/24/2017    Priority: 3.   Closed fracture of right wrist 06/24/2017    Priority: 3.   Polyp of colon, adenomatous 02/06/2015    Priority: 3.   OSA on CPAP 07/11/2008    Priority: 3.   Past Surgical History:  Procedure Laterality Date   ABDOMINAL HYSTERECTOMY     severe bleeding, still has ovaries   cataract surgery     bilateral   COLONOSCOPY  07/24/2020   01/31/15- Scarlette Shorts   FLEXIBLE SIGMOIDOSCOPY  2001   POLYPECTOMY     SIGMOIDOSCOPY      Family History  Problem Relation Age of Onset   Stroke Mother    Stroke Father    Hypertension Other    Lung cancer Brother        x2 smokers   Scleroderma Brother        "died from thrombosis"   Other Sister        spinal muscular atrophy- 1 sister, 2 brothers   Colon cancer Neg Hx    Colon polyps Neg Hx    Esophageal cancer Neg Hx    Rectal cancer Neg Hx    Stomach  cancer Neg Hx     Medications- reviewed and updated Current Outpatient Medications  Medication Sig Dispense Refill   fluticasone (FLONASE) 50 MCG/ACT nasal spray SPRAY 2 SPRAYS INTO EACH NOSTRIL EVERY DAY 48 mL 3   Multiple Vitamin (MULTIVITAMIN) capsule Take 1 capsule by mouth daily.     olmesartan (BENICAR) 5 MG tablet Take 1 tablet (5 mg total) by mouth daily. 90 tablet 3   No current facility-administered medications for this visit.    Allergies-reviewed and updated Allergies  Allergen Reactions   Tetanus-Diphtheria Toxoids Td     Severe rash, fever, hospitalized.    Tetanus-Diphtheria Toxoids Td     Severe rash, fever, hospitalized.    Codeine Sulfate     REACTION: unspecified    Social History   Social History Narrative   Married (49 years in [facility] patient of Dr. Yong Channel). 3 children. 5 grandkids.       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 Oropharynx normal Neck: no thyromegaly CV: RRR no murmurs rubs or gallops Lungs: CTAB no crackles, wheeze, rhonchi Abdomen: soft/nontender/nondistended/normal bowel sounds. No rebound or guarding.  Ext: no edema Skin: warm, dry, 7-8 cm and 7-8 cm lipoma on left upper shoulder Neuro: grossly normal, moves all extremities, PERRLA   Assessment and Plan   75 y.o. female presenting for annual physical.  Health Maintenance counseling: 1. Anticipatory guidance: Patient counseled regarding regular dental exams -q6 months, eye exams - yearly- sometimes more right q3 monthswith dry eye after cataract surgery,  avoiding smoking and second hand smoke , limiting alcohol to 1 beverage per day- doesn't drink .  Marland Kitchen  No illicit drugs.   2. Risk factor reduction:  Advised patient of need for regular exercise and diet rich and fruits and vegetables to reduce risk of heart attack and stroke.Exercise- had been walking regularly but has fallen off some lately- did get some steps helping kids move into new home. Plans to restart walking Diet-with recent stressors weight up 8 lbs- plans to reverse this trend  Wt Readings from Last 3 Encounters:  03/25/21 222 lb 9.6 oz (101 kg)  01/17/21 224 lb (101.6 kg)  07/24/20 224 lb (101.6 kg)  3. Immunizations/screenings/ancillary studies DISCUSSED:  -COVID vaccination #3 repeat planned -declines for now- recommended bivalent booster -TDAP vaccination - severe rash/fever/hospitalization- allergy- should not have this -Flu vaccination (last one 10/15) - declines for now Immunization History  Administered Date(s) Administered   Influenza-Unspecified 03/16/2014   Moderna Sars-Covid-2 Vaccination 07/19/2019, 08/17/2019   Pneumococcal Conjugate-13 12/01/2014   Pneumococcal Polysaccharide-23 12/17/2011   Zoster Recombinat (Shingrix) 12/13/2018, 02/10/2019   Zoster, Live 12/17/2011  4. Cervical cancer screening-  passed age based screening. No history abnormal pap. Not seeing gynecology.  5. Breast cancer screening-  breast exam declined  and mammogram 06/04/20 and had 3d  6. Colon cancer screening - colonoscopy 07/24/2020 with no repeat due to favorable findings and age per DR. Henrene Pastor letter 7. Skin cancer screening- has seen derm in past- she plans to call to schedule. advised regular sunscreen use. Denies worrisome, changing, or new skin lesions.  8. Birth control/STD check- monogamous and postmenopausal  9. Osteoporosis screening at 54- DEXA 01/07/2019 osteopenia lumbar spine- prefers to wait until next year to repeat -Never smoker  Status of chronic or acute concerns   #social- daughter and son in law and special need child just moved out- was tough but enjoyed having them  close  #hypertension S: medication: Olmesartan 5 mg daily (was 20 mg- rarely needed to use 10 mg and has a few left o use this for)- slightly up with weight gain -prior needed hctz as well Home readings #s: 120/70s at home for most part BP Readings from Last 3 Encounters:  03/25/21 136/86  01/17/21 118/74  07/24/20 (!) 142/70  A/P: overall stable- continue current meds  # Hyperglycemia/insulin resistance/prediabetes S:  Medication: Had sent a message about Metformin being started last July but does not appear she got this message Lab Results  Component Value Date   HGBA1C 6.0 (H) 05/30/2020   HGBA1C 6.3 12/16/2018   HGBA1C 6.2 03/04/2017   A/P: Hopefully stable-update A1c today-has had some weight gain but plans on reversing this  #  Mild ALT elevation-updated LFTs- work on lifestyle Lab Results  Component Value Date   ALT 35 (H) 05/30/2020   AST 35 05/30/2020   ALKPHOS 51 12/16/2018   BILITOT 0.6 05/30/2020   #hyperlipidemia S: Medication:None Lab Results  Component Value Date   CHOL 233 (H) 05/30/2020   HDL 45 (L) 05/30/2020   LDLCALC 156 (H) 05/30/2020   LDLDIRECT 159.1 01/04/2010   TRIG 184 (H) 05/30/2020   CHOLHDL 5.2 (H) 05/30/2020   A/P: 10-year ASCVD risk over 20%-she would strongly prefer to work on diet and exercise over starting medicine.  We did briefly discuss coronary artery calcium scoring-she wants to think this over and get her lipid levels back before making a decision   #lipoma noted left shoulder- daughter noted thanksgiving but patient had never noted- not sure how long there but will monitor to make sure not growing or painful. Declines surgery consult for now. Was 7-8 cm by 7-8 cm last visit  - stable on exam today- she wants to think about surgical removal- may also ask derm their opinion    Recommended follow up: Return in about 1 year (around 03/25/2022) for follow-up or sooner if needed.  Lab/Order associations:NOT fasting   ICD-10-CM   1.  Preventative health care  Z00.00 CBC with Differential/Platelet    Comprehensive metabolic panel    Lipid panel    Hemoglobin A1c    2. Essential hypertension  I10     3. Hyperglycemia  R73.9 Hemoglobin A1c    4. Hyperlipidemia, unspecified hyperlipidemia type  E78.5 CBC with Differential/Platelet    Comprehensive metabolic panel    Lipid panel    5. Lipoma of left upper extremity  D17.22      No orders of the defined types were placed in this encounter.  I,Jada Bradford,acting as a scribe for Garret Reddish, MD.,have documented all relevant documentation on the behalf of Garret Reddish, MD,as directed by  Garret Reddish, MD while in the presence of Garret Reddish, MD.  I, Garret Reddish, MD, have reviewed all documentation for this visit. The documentation on 03/25/21 for the exam, diagnosis, procedures, and orders are all accurate and complete.   Return precautions advised.  Garret Reddish, MD

## 2021-03-25 ENCOUNTER — Other Ambulatory Visit: Payer: Self-pay

## 2021-03-25 ENCOUNTER — Ambulatory Visit (INDEPENDENT_AMBULATORY_CARE_PROVIDER_SITE_OTHER): Payer: Medicare Other | Admitting: Family Medicine

## 2021-03-25 ENCOUNTER — Encounter: Payer: Self-pay | Admitting: Family Medicine

## 2021-03-25 VITALS — BP 136/86 | HR 59 | Temp 98.4°F | Ht 67.0 in | Wt 222.6 lb

## 2021-03-25 DIAGNOSIS — D1722 Benign lipomatous neoplasm of skin and subcutaneous tissue of left arm: Secondary | ICD-10-CM

## 2021-03-25 DIAGNOSIS — H35371 Puckering of macula, right eye: Secondary | ICD-10-CM | POA: Diagnosis not present

## 2021-03-25 DIAGNOSIS — E785 Hyperlipidemia, unspecified: Secondary | ICD-10-CM

## 2021-03-25 DIAGNOSIS — Z0000 Encounter for general adult medical examination without abnormal findings: Secondary | ICD-10-CM | POA: Diagnosis not present

## 2021-03-25 DIAGNOSIS — I10 Essential (primary) hypertension: Secondary | ICD-10-CM

## 2021-03-25 DIAGNOSIS — H02421 Myogenic ptosis of right eyelid: Secondary | ICD-10-CM | POA: Diagnosis not present

## 2021-03-25 DIAGNOSIS — R739 Hyperglycemia, unspecified: Secondary | ICD-10-CM | POA: Diagnosis not present

## 2021-03-25 DIAGNOSIS — H43813 Vitreous degeneration, bilateral: Secondary | ICD-10-CM | POA: Diagnosis not present

## 2021-03-25 DIAGNOSIS — H3561 Retinal hemorrhage, right eye: Secondary | ICD-10-CM | POA: Diagnosis not present

## 2021-03-25 DIAGNOSIS — H04123 Dry eye syndrome of bilateral lacrimal glands: Secondary | ICD-10-CM | POA: Diagnosis not present

## 2021-03-25 NOTE — Patient Instructions (Addendum)
Hold off COVID booster shot and flu shot at this time.   Please stop by lab before you go If you have mychart- we will send your results within 3 business days of [place] receiving them.  If you do not have mychart- we will call you about results within 5 business days of [place] receiving them.  *please also note that you will see labs on mychart as soon as they post. I will later go in and write notes on them- will say "notes from Dr. Yong Channel"  Recommended follow up: Return in about 1 year (around 03/25/2022) for physical or sooner if needed.

## 2021-03-26 LAB — COMPREHENSIVE METABOLIC PANEL
ALT: 34 U/L (ref 0–35)
AST: 34 U/L (ref 0–37)
Albumin: 4.5 g/dL (ref 3.5–5.2)
Alkaline Phosphatase: 56 U/L (ref 39–117)
BUN: 14 mg/dL (ref 6–23)
CO2: 29 mEq/L (ref 19–32)
Calcium: 10.2 mg/dL (ref 8.4–10.5)
Chloride: 100 mEq/L (ref 96–112)
Creatinine, Ser: 0.81 mg/dL (ref 0.40–1.20)
GFR: 71.15 mL/min (ref >60.00)
Glucose, Bld: 89 mg/dL (ref 70–99)
Potassium: 4.1 mEq/L (ref 3.5–5.1)
Sodium: 138 mEq/L (ref 135–145)
Total Bilirubin: 0.6 mg/dL (ref 0.2–1.2)
Total Protein: 7.8 g/dL (ref 6.0–8.3)

## 2021-03-26 LAB — CBC WITH DIFFERENTIAL/PLATELET
Basophils Absolute: 0.1 10*3/uL (ref 0.0–0.1)
Basophils Relative: 0.9 % (ref 0.0–3.0)
Eosinophils Absolute: 0.1 10*3/uL (ref 0.0–0.7)
Eosinophils Relative: 1.6 % (ref 0.0–5.0)
HCT: 38.6 % (ref 36.0–46.0)
Hemoglobin: 13.1 g/dL (ref 12.0–15.0)
Lymphocytes Relative: 28 % (ref 12.0–46.0)
Lymphs Abs: 2.1 10*3/uL (ref 0.7–4.0)
MCHC: 33.8 g/dL (ref 30.0–36.0)
MCV: 91.7 fl (ref 78.0–100.0)
Monocytes Absolute: 0.7 10*3/uL (ref 0.1–1.0)
Monocytes Relative: 9.7 % (ref 3.0–12.0)
Neutro Abs: 4.6 10*3/uL (ref 1.4–7.7)
Neutrophils Relative %: 59.8 % (ref 43.0–77.0)
Platelets: 232 10*3/uL (ref 150.0–400.0)
RBC: 4.21 Mil/uL (ref 3.87–5.11)
RDW: 13.7 % (ref 11.5–15.5)
WBC: 7.6 10*3/uL (ref 4.0–10.5)

## 2021-03-26 LAB — HEMOGLOBIN A1C: Hgb A1c MFr Bld: 6.1 % (ref 4.6–6.5)

## 2021-03-26 LAB — LIPID PANEL
Cholesterol: 222 mg/dL — ABNORMAL HIGH (ref 0–200)
HDL: 40.7 mg/dL (ref >39.00)
NonHDL: 181.09
Total CHOL/HDL Ratio: 5
Triglycerides: 212 mg/dL — ABNORMAL HIGH (ref 0.0–149.0)
VLDL: 42.4 mg/dL — ABNORMAL HIGH (ref 0.0–40.0)

## 2021-03-26 LAB — LDL CHOLESTEROL, DIRECT: Direct LDL: 145 mg/dL

## 2021-05-30 ENCOUNTER — Ambulatory Visit (INDEPENDENT_AMBULATORY_CARE_PROVIDER_SITE_OTHER): Payer: Medicare Other

## 2021-05-30 ENCOUNTER — Other Ambulatory Visit: Payer: Self-pay

## 2021-05-30 DIAGNOSIS — Z0000 Encounter for general adult medical examination without abnormal findings: Secondary | ICD-10-CM | POA: Diagnosis not present

## 2021-05-30 NOTE — Patient Instructions (Signed)
Erica Burke , Thank you for taking time to come for your Medicare Wellness Visit. I appreciate your ongoing commitment to your health goals. Please review the following plan we discussed and let me know if I can assist you in the future.   Screening recommendations/referrals: Colonoscopy: Done 07/24/20 repeat every 10 years  Mammogram: Done 06/04/20 repeat every year  Bone Density: Done 01/07/19 repeat very 2 years  Recommended yearly ophthalmology/optometry visit for glaucoma screening and checkup Recommended yearly dental visit for hygiene and checkup  Vaccinations: Influenza vaccine: Declined  Pneumococcal vaccine: Up to date Shingles vaccine: Completed 6/29 & 02/10/19   Covid-19:Completed 2/2 & 08/17/19  Advanced directives: Please bring a copy of your health care power of attorney and living will to the office at your convenience.  Conditions/risks identified: Continue to walk for exercise and stay healthy  Next appointment: Follow up in one year for your annual wellness visit    Preventive Care 65 Years and Older, Female Preventive care refers to lifestyle choices and visits with your health care provider that can promote health and wellness. What does preventive care include? A yearly physical exam. This is also called an annual well check. Dental exams once or twice a year. Routine eye exams. Ask your health care provider how often you should have your eyes checked. Personal lifestyle choices, including: Daily care of your teeth and gums. Regular physical activity. Eating a healthy diet. Avoiding tobacco and drug use. Limiting alcohol use. Practicing safe sex. Taking low-dose aspirin every day. Taking vitamin and mineral supplements as recommended by your health care provider. What happens during an annual well check? The services and screenings done by your health care provider during your annual well check will depend on your age, overall health, lifestyle risk factors, and  family history of disease. Counseling  Your health care provider may ask you questions about your: Alcohol use. Tobacco use. Drug use. Emotional well-being. Home and relationship well-being. Sexual activity. Eating habits. History of falls. Memory and ability to understand (cognition). Work and work Statistician. Reproductive health. Screening  You may have the following tests or measurements: Height, weight, and BMI. Blood pressure. Lipid and cholesterol levels. These may be checked every 5 years, or more frequently if you are over 20 years old. Skin check. Lung cancer screening. You may have this screening every year starting at age 71 if you have a 30-pack-year history of smoking and currently smoke or have quit within the past 15 years. Fecal occult blood test (FOBT) of the stool. You may have this test every year starting at age 20. Flexible sigmoidoscopy or colonoscopy. You may have a sigmoidoscopy every 5 years or a colonoscopy every 10 years starting at age 34. Hepatitis C blood test. Hepatitis B blood test. Sexually transmitted disease (STD) testing. Diabetes screening. This is done by checking your blood sugar (glucose) after you have not eaten for a while (fasting). You may have this done every 1-3 years. Bone density scan. This is done to screen for osteoporosis. You may have this done starting at age 21. Mammogram. This may be done every 1-2 years. Talk to your health care provider about how often you should have regular mammograms. Talk with your health care provider about your test results, treatment options, and if necessary, the need for more tests. Vaccines  Your health care provider may recommend certain vaccines, such as: Influenza vaccine. This is recommended every year. Tetanus, diphtheria, and acellular pertussis (Tdap, Td) vaccine. You may need a  Td booster every 10 years. Zoster vaccine. You may need this after age 86. Pneumococcal 13-valent conjugate  (PCV13) vaccine. One dose is recommended after age 71. Pneumococcal polysaccharide (PPSV23) vaccine. One dose is recommended after age 29. Talk to your health care provider about which screenings and vaccines you need and how often you need them. This information is not intended to replace advice given to you by your health care provider. Make sure you discuss any questions you have with your health care provider. Document Released: 06/29/2015 Document Revised: 02/20/2016 Document Reviewed: 04/03/2015 Elsevier Interactive Patient Education  2017 [place] Prevention in the Home Falls can cause injuries. They can happen to people of all ages. There are many things you can do to make your home safe and to help prevent falls. What can I do on the outside of my home? Regularly fix the edges of walkways and driveways and fix any cracks. Remove anything that might make you trip as you walk through a door, such as a raised step or threshold. Trim any bushes or trees on the path to your home. Use bright outdoor lighting. Clear any walking paths of anything that might make someone trip, such as rocks or tools. Regularly check to see if handrails are loose or broken. Make sure that both sides of any steps have handrails. Any raised decks and porches should have guardrails on the edges. Have any leaves, snow, or ice cleared regularly. Use sand or salt on walking paths during winter. Clean up any spills in your garage right away. This includes oil or grease spills. What can I do in the bathroom? Use night lights. Install grab bars by the toilet and in the tub and shower. Do not use towel bars as grab bars. Use non-skid mats or decals in the tub or shower. If you need to sit down in the shower, use a plastic, non-slip stool. Keep the floor dry. Clean up any water that spills on the floor as soon as it happens. Remove soap buildup in the tub or shower regularly. Attach bath mats securely with  double-sided non-slip rug tape. Do not have throw rugs and other things on the floor that can make you trip. What can I do in the bedroom? Use night lights. Make sure that you have a light by your bed that is easy to reach. Do not use any sheets or blankets that are too big for your bed. They should not hang down onto the floor. Have a firm chair that has side arms. You can use this for support while you get dressed. Do not have throw rugs and other things on the floor that can make you trip. What can I do in the kitchen? Clean up any spills right away. Avoid walking on wet floors. Keep items that you use a lot in easy-to-reach places. If you need to reach something above you, use a strong step stool that has a grab bar. Keep electrical cords out of the way. Do not use floor polish or wax that makes floors slippery. If you must use wax, use non-skid floor wax. Do not have throw rugs and other things on the floor that can make you trip. What can I do with my stairs? Do not leave any items on the stairs. Make sure that there are handrails on both sides of the stairs and use them. Fix handrails that are broken or loose. Make sure that handrails are as long as the stairways. Check any  carpeting to make sure that it is firmly attached to the stairs. Fix any carpet that is loose or worn. Avoid having throw rugs at the top or bottom of the stairs. If you do have throw rugs, attach them to the floor with carpet tape. Make sure that you have a light switch at the top of the stairs and the bottom of the stairs. If you do not have them, ask someone to add them for you. What else can I do to help prevent falls? Wear shoes that: Do not have high heels. Have rubber bottoms. Are comfortable and fit you well. Are closed at the toe. Do not wear sandals. If you use a stepladder: Make sure that it is fully opened. Do not climb a closed stepladder. Make sure that both sides of the stepladder are locked  into place. Ask someone to hold it for you, if possible. Clearly mark and make sure that you can see: Any grab bars or handrails. First and last steps. Where the edge of each step is. Use tools that help you move around (mobility aids) if they are needed. These include: Canes. Walkers. Scooters. Crutches. Turn on the lights when you go into a dark area. Replace any light bulbs as soon as they burn out. Set up your furniture so you have a clear path. Avoid moving your furniture around. If any of your floors are uneven, fix them. If there are any pets around you, be aware of where they are. Review your medicines with your doctor. Some medicines can make you feel dizzy. This can increase your chance of falling. Ask your doctor what other things that you can do to help prevent falls. This information is not intended to replace advice given to you by your health care provider. Make sure you discuss any questions you have with your health care provider. Document Released: 03/29/2009 Document Revised: 11/08/2015 Document Reviewed: 07/07/2014 Elsevier Interactive Patient Education  2017 [employer].

## 2021-05-30 NOTE — Progress Notes (Signed)
Virtual Visit via Telephone Note  I connected with  Erica Burke on 05/30/21 at  8:00 AM EST by telephone and verified that I am speaking with the correct person using two identifiers.  Medicare Annual Wellness visit completed telephonically due to Covid-19 pandemic.   Persons participating in this call: This Health Coach and this patient.   Location: Patient: home Provider: office   I discussed the limitations, risks, security and privacy concerns of performing an evaluation and management service by telephone and the availability of in person appointments. The patient expressed understanding and agreed to proceed.  Unable to perform video visit due to video visit attempted and failed and/or patient does not have video capability.   Some vital signs may be absent or patient reported.   Willette Brace, LPN   Subjective:   Erica Burke is a 75 y.o. female who presents for an Initial Medicare Annual Wellness Visit.  Review of Systems     Cardiac Risk Factors include: advanced age (>56men, >41 women);hypertension;dyslipidemia;obesity (BMI >30kg/m2)     Objective:    There were no vitals filed for this visit. There is no height or weight on file to calculate BMI.  Advanced Directives 05/30/2021 01/01/2016 01/17/2015  Does Patient Have a Medical Advance Directive? Yes Yes Yes  Type of Academic librarian Living will [place];Living will  Copy of [place] in Chart? No - copy requested No - copy requested -    Current Medications (verified) Outpatient Encounter Medications as of 05/30/2021  Medication Sig   fluticasone (FLONASE) 50 MCG/ACT nasal spray SPRAY 2 SPRAYS INTO EACH NOSTRIL EVERY DAY   Multiple Vitamin (MULTIVITAMIN) capsule Take 1 capsule by mouth daily.   olmesartan (BENICAR) 5 MG tablet Take 1 tablet (5 mg total) by mouth daily.   RESTASIS 0.05 % ophthalmic emulsion 1 drop 2 (two) times daily.   No  facility-administered encounter medications on file as of 05/30/2021.    Allergies (verified) Tetanus-diphtheria toxoids td, Tetanus-diphtheria toxoids td, and Codeine sulfate   History: Past Medical History:  Diagnosis Date   Allergy    mild    Anemia    past hx - 20's early 30's   Cataract    removed bilat   Hyperlipidemia    Hypertension    Sleep apnea    uses cpap   Thyroid disease    in her 20's- short period    TRANSAMINASES, SERUM, ELEVATED 10/19/2008   Mild elevations - resolved     Past Surgical History:  Procedure Laterality Date   ABDOMINAL HYSTERECTOMY     severe bleeding, still has ovaries   cataract surgery     bilateral   COLONOSCOPY  07/24/2020   01/31/15- Scarlette Shorts   FLEXIBLE SIGMOIDOSCOPY  2001   POLYPECTOMY     SIGMOIDOSCOPY     Family History  Problem Relation Age of Onset   Stroke Mother    Stroke Father    Hypertension Other    Lung cancer Brother        x2 smokers   Scleroderma Brother        "died from thrombosis"   Other Sister        spinal muscular atrophy- 1 sister, 2 brothers   Colon cancer Neg Hx    Colon polyps Neg Hx    Esophageal cancer Neg Hx    Rectal cancer Neg Hx    Stomach cancer Neg Hx    Social  History   Socioeconomic History   Marital status: Married    Spouse name: Not on file   Number of children: Not on file   Years of education: Not on file   Highest education level: Not on file  Occupational History   Not on file  Tobacco Use   Smoking status: Never   Smokeless tobacco: Never  Substance and Sexual Activity   Alcohol use: No    Alcohol/week: 0.0 standard drinks   Drug use: No   Sexual activity: Not on file  Other Topics Concern   Not on file  Social History Narrative   Married (49 years in [facility] patient of Dr. Yong Channel). 3 children. 5 grandkids.       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      Hobbies: time with grandkids, [place]   Social Determinants of Health    Financial Resource Strain: Low Risk    Difficulty of Paying Living Expenses: Not hard at all  Food Insecurity: No Food Insecurity   Worried About Charity fundraiser in the Last Year: Never true   Arboriculturist in the Last Year: Never true  Transportation Needs: No Transportation Needs   Lack of Transportation (Medical): No   Lack of Transportation (Non-Medical): No  Physical Activity: Sufficiently Active   Days of Exercise per Week: 5 days   Minutes of Exercise per Session: 40 min  Stress: No Stress Concern Present   Feeling of Stress : Not at all  Social Connections: Moderately Integrated   Frequency of Communication with Friends and Family: More than three times a week   Frequency of Social Gatherings with Friends and Family: More than three times a week   Attends Religious Services: More than 4 times per year   Active Member of [employer] or Organizations: No   Attends Music therapist: Never   Marital Status: Married    Tobacco Counseling Counseling given: Not Answered   Clinical Intake:  Pre-visit preparation completed: Yes  Pain : No/denies pain     BMI - recorded: 34.86 Nutritional Status: BMI > 30  Obese Nutritional Risks: None Diabetes: No  How often do you need to have someone help you when you read instructions, pamphlets, or other written materials from your doctor or pharmacy?: 1 - Never  Diabetic?no   Interpreter Needed?: No  Information entered by :: Ala Bent, LPN   Activities of Daily Living In your present state of health, do you have any difficulty performing the following activities: 05/30/2021 03/25/2021  Hearing? Y N  Comment slight loss and has hearing aids -  Vision? N Y  Difficulty concentrating or making decisions? N N  Walking or climbing stairs? N N  Dressing or bathing? N N  Doing errands, shopping? N N  Preparing Food and eating ? N -  Using the Toilet? N -  In the past six months, have you accidently leaked  urine? N -  Do you have problems with loss of bowel control? N -  Managing your Medications? N -  Managing your Finances? N -  Housekeeping or managing your Housekeeping? N -  Some recent data might be hidden    Patient Care Team: Marin Olp, MD as PCP - General (Family Medicine)  Indicate any recent Medical Services you may have received from other than [facility] providers in the past year (date may be approximate).     Assessment:   This is a routine  wellness examination for Erica Burke.  Hearing/Vision screen Hearing Screening - Comments:: Pt stated slight hearing loss and has hearing aids  Vision Screening - Comments:: Pt follows up with [facility] for annual eye exams   Dietary issues and exercise activities discussed: Current Exercise Habits: Home exercise routine, Type of exercise: walking, Time (Minutes): 45, Frequency (Times/Week): 5, Weekly Exercise (Minutes/Week): 225   Goals Addressed             This Visit's Progress    Patient Stated       Continue walking for exercise        Depression Screen PHQ 2/9 Scores 05/30/2021 03/25/2021 05/30/2020 12/13/2018 03/04/2017 01/01/2016 12/01/2014  PHQ - 2 Score 0 0 0 0 0 0 0    Fall Risk Fall Risk  05/30/2021 03/25/2021 06/30/2019 06/30/2019 03/04/2017  Falls in the past year? 0 0 0 0 No  Number falls in past yr: 0 0 - - -  Injury with Fall? 0 0 - - -  Risk for fall due to : - No Fall Risks - - -  Follow up Falls prevention discussed Falls evaluation completed - - -    FALL RISK PREVENTION PERTAINING TO THE HOME:  Any stairs in or around the home? Yes  If so, are there any without handrails? No  Home free of loose throw rugs in walkways, pet beds, electrical cords, etc? Yes  Adequate lighting in your home to reduce risk of falls? Yes   ASSISTIVE DEVICES UTILIZED TO PREVENT FALLS:  Life alert? No  Use of a cane, walker or w/c? No  Grab bars in the bathroom? Yes  Shower chair or bench in shower? No  Elevated toilet  seat or a handicapped toilet? No   TIMED UP AND GO:  Was the test performed? No .   Cognitive Function:     6CIT Screen 05/30/2021  What Year? 0 points  What month? 0 points  What time? 0 points  Count back from 20 0 points  Months in reverse 0 points  Repeat phrase 0 points  Total Score 0    Immunizations Immunization History  Administered Date(s) Administered   Influenza-Unspecified 03/16/2014   Moderna Sars-Covid-2 Vaccination 07/19/2019, 08/17/2019   Pneumococcal Conjugate-13 12/01/2014   Pneumococcal Polysaccharide-23 12/17/2011   Zoster Recombinat (Shingrix) 12/13/2018, 02/10/2019   Zoster, Live 12/17/2011      Flu Vaccine status: Declined, Education has been provided regarding the importance of this vaccine but patient still declined. Advised may receive this vaccine at local pharmacy or Health Dept. Aware to provide a copy of the vaccination record if obtained from local pharmacy or Health Dept. Verbalized acceptance and understanding.  Pneumococcal vaccine status: Up to date  Covid-19 vaccine status: Declined, Education has been provided regarding the importance of this vaccine but patient still declined. Advised may receive this vaccine at local pharmacy or Health Dept.or vaccine clinic. Aware to provide a copy of the vaccination record if obtained from local pharmacy or Health Dept. Verbalized acceptance and understanding.  Qualifies for Shingles Vaccine? Yes   Zostavax completed Yes   Shingrix Completed?: Yes  Screening Tests Health Maintenance  Topic Date Due   INFLUENZA VACCINE  09/13/2021 (Originally 01/14/2021)   COLONOSCOPY (Pts 45-51yrs Insurance coverage will need to be confirmed)  07/24/2030   Pneumonia Vaccine 69+ Years old  Completed   DEXA SCAN  Completed   Hepatitis C Screening  Completed   Zoster Vaccines- Shingrix  Completed   HPV VACCINES  Aged  Out   TETANUS/TDAP  Discontinued   COVID-19 Vaccine  Discontinued    Health  Maintenance  There are no preventive care reminders to display for this patient.  Colorectal cancer screening: Type of screening: Colonoscopy. Completed 07/24/20. Repeat every 10 years  Mammogram status: Completed 06/04/20. Repeat every year  Bone Density status: Completed 01/07/19. Results reflect: Bone density results: OSTEOPOROSIS. Repeat every 2 years.   Additional Screening:  Hepatitis C Screening:  Completed 10/19/08  Vision Screening: Recommended annual ophthalmology exams for early detection of glaucoma and other disorders of the eye. Is the patient up to date with their annual eye exam?  Yes  Who is the provider or what is the name of the office in which the patient attends annual eye exams? [facility]  If pt is not established with a provider, would they like to be referred to a provider to establish care? No .   Dental Screening: Recommended annual dental exams for proper oral hygiene  Community Resource Referral / Chronic Care Management: CRR required this visit?  No   CCM required this visit?  No      Plan:     I have personally reviewed and noted the following in the patients chart:   Medical and social history Use of alcohol, tobacco or illicit drugs  Current medications and supplements including opioid prescriptions. Patient is not currently taking opioid prescriptions. Functional ability and status Nutritional status Physical activity Advanced directives List of other physicians Hospitalizations, surgeries, and ER visits in previous 12 months Vitals Screenings to include cognitive, depression, and falls Referrals and appointments  In addition, I have reviewed and discussed with patient certain preventive protocols, quality metrics, and best practice recommendations. A written personalized care plan for preventive services as well as general preventive health recommendations were provided to patient.     Willette Brace, LPN   [id number]    Nurse Notes: None

## 2021-06-03 DIAGNOSIS — G4733 Obstructive sleep apnea (adult) (pediatric): Secondary | ICD-10-CM | POA: Diagnosis not present

## 2021-07-24 DIAGNOSIS — H16223 Keratoconjunctivitis sicca, not specified as Sjogren's, bilateral: Secondary | ICD-10-CM | POA: Diagnosis not present

## 2021-10-16 DIAGNOSIS — M9902 Segmental and somatic dysfunction of thoracic region: Secondary | ICD-10-CM | POA: Diagnosis not present

## 2021-10-16 DIAGNOSIS — M9901 Segmental and somatic dysfunction of cervical region: Secondary | ICD-10-CM | POA: Diagnosis not present

## 2021-10-16 DIAGNOSIS — M5032 Other cervical disc degeneration, mid-cervical region, unspecified level: Secondary | ICD-10-CM | POA: Diagnosis not present

## 2021-10-16 DIAGNOSIS — M5136 Other intervertebral disc degeneration, lumbar region: Secondary | ICD-10-CM | POA: Diagnosis not present

## 2021-10-16 DIAGNOSIS — M9903 Segmental and somatic dysfunction of lumbar region: Secondary | ICD-10-CM | POA: Diagnosis not present

## 2021-10-16 DIAGNOSIS — M5134 Other intervertebral disc degeneration, thoracic region: Secondary | ICD-10-CM | POA: Diagnosis not present

## 2021-10-21 DIAGNOSIS — H16223 Keratoconjunctivitis sicca, not specified as Sjogren's, bilateral: Secondary | ICD-10-CM | POA: Diagnosis not present

## 2021-10-22 DIAGNOSIS — M5136 Other intervertebral disc degeneration, lumbar region: Secondary | ICD-10-CM | POA: Diagnosis not present

## 2021-10-22 DIAGNOSIS — M9901 Segmental and somatic dysfunction of cervical region: Secondary | ICD-10-CM | POA: Diagnosis not present

## 2021-10-22 DIAGNOSIS — M5032 Other cervical disc degeneration, mid-cervical region, unspecified level: Secondary | ICD-10-CM | POA: Diagnosis not present

## 2021-10-22 DIAGNOSIS — M9903 Segmental and somatic dysfunction of lumbar region: Secondary | ICD-10-CM | POA: Diagnosis not present

## 2021-10-22 DIAGNOSIS — M5134 Other intervertebral disc degeneration, thoracic region: Secondary | ICD-10-CM | POA: Diagnosis not present

## 2021-10-22 DIAGNOSIS — M9902 Segmental and somatic dysfunction of thoracic region: Secondary | ICD-10-CM | POA: Diagnosis not present

## 2021-10-29 DIAGNOSIS — M9901 Segmental and somatic dysfunction of cervical region: Secondary | ICD-10-CM | POA: Diagnosis not present

## 2021-10-29 DIAGNOSIS — M5136 Other intervertebral disc degeneration, lumbar region: Secondary | ICD-10-CM | POA: Diagnosis not present

## 2021-10-29 DIAGNOSIS — M9903 Segmental and somatic dysfunction of lumbar region: Secondary | ICD-10-CM | POA: Diagnosis not present

## 2021-10-29 DIAGNOSIS — M5134 Other intervertebral disc degeneration, thoracic region: Secondary | ICD-10-CM | POA: Diagnosis not present

## 2021-10-29 DIAGNOSIS — M5032 Other cervical disc degeneration, mid-cervical region, unspecified level: Secondary | ICD-10-CM | POA: Diagnosis not present

## 2021-10-29 DIAGNOSIS — M9902 Segmental and somatic dysfunction of thoracic region: Secondary | ICD-10-CM | POA: Diagnosis not present

## 2021-11-03 ENCOUNTER — Other Ambulatory Visit: Payer: Self-pay | Admitting: Family Medicine

## 2021-11-05 DIAGNOSIS — M5032 Other cervical disc degeneration, mid-cervical region, unspecified level: Secondary | ICD-10-CM | POA: Diagnosis not present

## 2021-11-05 DIAGNOSIS — M9902 Segmental and somatic dysfunction of thoracic region: Secondary | ICD-10-CM | POA: Diagnosis not present

## 2021-11-05 DIAGNOSIS — M5134 Other intervertebral disc degeneration, thoracic region: Secondary | ICD-10-CM | POA: Diagnosis not present

## 2021-11-05 DIAGNOSIS — M9903 Segmental and somatic dysfunction of lumbar region: Secondary | ICD-10-CM | POA: Diagnosis not present

## 2021-11-05 DIAGNOSIS — M9901 Segmental and somatic dysfunction of cervical region: Secondary | ICD-10-CM | POA: Diagnosis not present

## 2021-11-05 DIAGNOSIS — M5136 Other intervertebral disc degeneration, lumbar region: Secondary | ICD-10-CM | POA: Diagnosis not present

## 2021-12-11 DIAGNOSIS — L814 Other melanin hyperpigmentation: Secondary | ICD-10-CM | POA: Diagnosis not present

## 2021-12-11 DIAGNOSIS — L821 Other seborrheic keratosis: Secondary | ICD-10-CM | POA: Diagnosis not present

## 2021-12-11 DIAGNOSIS — D2371 Other benign neoplasm of skin of right lower limb, including hip: Secondary | ICD-10-CM | POA: Diagnosis not present

## 2022-01-30 DIAGNOSIS — D171 Benign lipomatous neoplasm of skin and subcutaneous tissue of trunk: Secondary | ICD-10-CM | POA: Diagnosis not present

## 2022-03-03 ENCOUNTER — Ambulatory Visit (HOSPITAL_COMMUNITY): Admission: RE | Admit: 2022-03-03 | Payer: Medicare Other | Source: Home / Self Care | Admitting: Surgery

## 2022-03-03 ENCOUNTER — Encounter (HOSPITAL_COMMUNITY): Admission: RE | Payer: Self-pay | Source: Home / Self Care

## 2022-03-03 SURGERY — EXCISION MASS
Anesthesia: General | Laterality: Left

## 2022-03-10 ENCOUNTER — Encounter: Payer: Self-pay | Admitting: *Deleted

## 2022-03-31 ENCOUNTER — Encounter: Payer: Medicare Other | Admitting: Family Medicine

## 2022-04-17 DIAGNOSIS — G4733 Obstructive sleep apnea (adult) (pediatric): Secondary | ICD-10-CM | POA: Diagnosis not present

## 2022-04-28 DIAGNOSIS — H35371 Puckering of macula, right eye: Secondary | ICD-10-CM | POA: Diagnosis not present

## 2022-04-28 DIAGNOSIS — D3131 Benign neoplasm of right choroid: Secondary | ICD-10-CM | POA: Diagnosis not present

## 2022-04-28 DIAGNOSIS — H43813 Vitreous degeneration, bilateral: Secondary | ICD-10-CM | POA: Diagnosis not present

## 2022-04-28 DIAGNOSIS — H16223 Keratoconjunctivitis sicca, not specified as Sjogren's, bilateral: Secondary | ICD-10-CM | POA: Diagnosis not present

## 2022-05-29 ENCOUNTER — Encounter: Payer: Self-pay | Admitting: *Deleted

## 2022-07-10 ENCOUNTER — Ambulatory Visit: Payer: Medicare Other

## 2022-08-05 ENCOUNTER — Other Ambulatory Visit: Payer: Self-pay | Admitting: Family Medicine

## 2022-08-05 DIAGNOSIS — Z1231 Encounter for screening mammogram for malignant neoplasm of breast: Secondary | ICD-10-CM

## 2022-08-21 ENCOUNTER — Ambulatory Visit (INDEPENDENT_AMBULATORY_CARE_PROVIDER_SITE_OTHER): Payer: Medicare Other | Admitting: Family Medicine

## 2022-08-21 ENCOUNTER — Encounter: Payer: Self-pay | Admitting: Family Medicine

## 2022-08-21 VITALS — BP 128/80 | HR 64 | Temp 97.8°F | Ht 67.0 in | Wt 225.6 lb

## 2022-08-21 DIAGNOSIS — Z131 Encounter for screening for diabetes mellitus: Secondary | ICD-10-CM

## 2022-08-21 DIAGNOSIS — R739 Hyperglycemia, unspecified: Secondary | ICD-10-CM

## 2022-08-21 DIAGNOSIS — Z0000 Encounter for general adult medical examination without abnormal findings: Secondary | ICD-10-CM | POA: Diagnosis not present

## 2022-08-21 DIAGNOSIS — E785 Hyperlipidemia, unspecified: Secondary | ICD-10-CM | POA: Diagnosis not present

## 2022-08-21 DIAGNOSIS — M8000XS Age-related osteoporosis with current pathological fracture, unspecified site, sequela: Secondary | ICD-10-CM | POA: Diagnosis not present

## 2022-08-21 MED ORDER — OLMESARTAN MEDOXOMIL 20 MG PO TABS: 20.0000 mg | ORAL_TABLET | Freq: Every day | ORAL | 3 refills | Status: DC

## 2022-08-21 MED ORDER — HYDROCHLOROTHIAZIDE 25 MG PO TABS: 12.5000 mg | ORAL_TABLET | Freq: Every day | ORAL | 0 refills | Status: DC | PRN

## 2022-08-21 NOTE — Patient Instructions (Addendum)
You are eligible to schedule your annual wellness visit with our nurse specialist Otila Kluver.  Please consider scheduling this before you leave today  Please stop by lab before you go If you have mychart- we will send your results within 3 business days of [place] receiving them.  If you do not have mychart- we will call you about results within 5 business days of [place] receiving them.  *please also note that you will see labs on mychart as soon as they post. I will later go in and write notes on them- will say "notes from Dr. Yong Channel"   Recommended follow up: Return in about 6 months (around 02/21/2023) for followup or sooner if needed.Schedule b4 you leave.

## 2022-08-21 NOTE — Progress Notes (Signed)
Phone [phone]   Subjective:  Patient presents today for their annual physical. Chief complaint-noted.   See problem oriented charting- ROS- full  review of systems was completed and negative Per full ROS sheet completed by patient- some fatigue/decrease of energy- no shortness of breath or chest pain   The following were reviewed and entered/updated in epic: Past Medical History:  Diagnosis Date   Allergy    mild    Anemia    past hx - 20's early 30's   Cataract    removed bilat   Hyperlipidemia    Hypertension    Sleep apnea    uses cpap   Thyroid disease    in her 20's- short period    TRANSAMINASES, SERUM, ELEVATED 10/19/2008   Mild elevations - resolved     Patient Active Problem List   Diagnosis Date Noted   Osteoporosis 03/04/2017    Priority: High   Hyperglycemia 12/01/2014    Priority: Medium    Essential hypertension 10/19/2008    Priority: Medium    Hyperlipidemia 01/25/2007    Priority: Medium    Closed fracture of one rib of right side 06/24/2017    Priority: Low   Closed fracture of right wrist 06/24/2017    Priority: Low   Polyp of colon, adenomatous 02/06/2015    Priority: Low   OSA on CPAP 07/11/2008    Priority: Low   Past Surgical History:  Procedure Laterality Date   ABDOMINAL HYSTERECTOMY     severe bleeding, still has ovaries   cataract surgery     bilateral   COLONOSCOPY  07/24/2020   01/31/15- Scarlette Shorts   FLEXIBLE SIGMOIDOSCOPY  2001   POLYPECTOMY     SIGMOIDOSCOPY      Family History  Problem Relation Age of Onset   Stroke Mother    Stroke Father    Hypertension Other    Lung cancer Brother        x2 smokers   Scleroderma Brother        "died from thrombosis"   Other Sister        spinal muscular atrophy- 1 sister, 2 brothers   Colon cancer Neg Hx    Colon polyps Neg Hx    Esophageal cancer Neg Hx    Rectal cancer Neg Hx    Stomach cancer Neg Hx     Medications- reviewed and updated Current Outpatient  Medications  Medication Sig Dispense Refill   Multiple Vitamin (MULTIVITAMIN) capsule Take 1 capsule by mouth daily.     fluticasone (FLONASE) 50 MCG/ACT nasal spray SPRAY 2 SPRAYS INTO EACH NOSTRIL EVERY DAY (Patient not taking: Reported on 08/21/2022) 48 mL 3   hydrochlorothiazide (HYDRODIURIL) 25 MG tablet Take 0.5 tablets (12.5 mg total) by mouth daily as needed (if blood pressure >140/90). 45 tablet 0   olmesartan (BENICAR) 20 MG tablet Take 1 tablet (20 mg total) by mouth daily. 90 tablet 3   TYRVAYA 0.03 MG/ACT SOLN Place 1 spray into both nostrils 2 (two) times daily.     No current facility-administered medications for this visit.    Allergies-reviewed and updated Allergies  Allergen Reactions   Tetanus-Diphtheria Toxoids Td     Severe rash, fever, hospitalized.    Tetanus-Diphtheria Toxoids Td     Severe rash, fever, hospitalized.    Codeine Sulfate     REACTION: unspecified    Social History   Social History Narrative   Married (49 years in 2016[facility] patient of Dr. Yong Channel).  3 children. 5 grandkids.       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 Oropharynx normal Neck: no thyromegaly CV: RRR no murmurs rubs or gallops Lungs: CTAB no crackles, wheeze, rhonchi Abdomen: soft/nontender/nondistended/normal bowel sounds. No rebound or guarding.  Ext: no edema Skin: warm, dry Neuro: grossly normal, moves all extremities, PERRLA   Assessment and Plan   77 y.o. female presenting for annual physical.  Health Maintenance counseling: 1. Anticipatory guidance: Patient counseled regarding regular dental exams - working with [facility] - currently working with a first year, eye exams - yearly  at least- more regular due to dry eyes after surgery,  avoiding smoking and second hand smoke , limiting alcohol to 1 beverage per day- none , no illicit drugs .   2. Risk factor reduction:  Advised patient of need for regular exercise and diet rich and fruits and vegetables to reduce risk of heart attack and stroke.  Exercise- walking 2 miles some days but has had set backs with winter months and illness- usually at least once a week Diet/weight management-within 3 lbs of last physical and got as low as 214- plans to work back down again. Plans to cut down on portoins and increase activity Wt Readings from Last 3 Encounters:  08/21/22 225 lb 9.6 oz (102.3 kg)  03/25/21 222 lb 9.6 oz (101 kg)  01/17/21 224 lb (101.6 kg)  3. Immunizations/screenings/ancillary studies- does not plan on further covid shots, typically does not do flu shot- opts out, holding off on Tdap- severe rash/fever/hospitalization. RSV hold off as latse in season Immunization History  Administered Date(s) Administered   Influenza-Unspecified 03/16/2014   Moderna Sars-Covid-2 Vaccination 07/19/2019, 08/17/2019   Pneumococcal Conjugate-13 12/01/2014   Pneumococcal Polysaccharide-23 12/17/2011   Zoster Recombinat (Shingrix) 12/13/2018, 02/10/2019   Zoster, Live 12/17/2011  4. Cervical cancer screening- past age based screening recommendations. No hx abnormal pap- not eeing gynecology 5. Breast cancer screening-  breast exam - opts out- and mammogram scheduled in April- last 2021 6. Colon cancer screening - History of adenomatous polyp of the colon 07/24/2020-no repeat due to age per GI Dr. Henrene Pastor  7. Skin cancer screening- sees dermatology. advised regular sunscreen use. Denies worrisome, changing, or new skin lesions.  8. Birth control/STD check- only active with husband/postmenopausal 9. Osteoporosis screening at 40- prior fragility fracture so technically has osteoporosis givne osteopenia- she has increased her D consumption-  will check vitamin D today. Declines dexa for now- wants to check with insurance to see what her portion would be 10. Smoking associated screening - never smoker  Status of chronic or acute concerns   #social update- staying busy/active- still caring for her granddaughter with special needs but no longer lives with her. Teaches Sunday school   #hypertension S: medication: olmesartan 5 mg- taking 20 mg for a few weeks- old rx. Hydrochlorothiazide back up- has not needed BP Readings from Last 3 Encounters:  08/21/22 128/80  03/25/21 136/86  01/17/21 118/74  A/P: blood pressure stable- continue current medicines - has been taking 20 mg instead of 5 mg and we sent in new rx- will update  labs and assess as had prior lab abnormalities like creatinine up and calcium up before reduction- but believe that was before hydrochlorothiazide stopped  #hyperlipidemia S: Medication:none  Lab Results  Component Value Date   CHOL 222 (H) 03/25/2021   HDL 40.70 03/25/2021   LDLCALC 156 (H) 05/30/2020   LDLDIRECT 145.0 03/25/2021   TRIG 212.0 (H) 03/25/2021   CHOLHDL 5 03/25/2021   A/P: The 10-year ASCVD risk score (Arnett DK, et al., 2019) is: 23%. Wants to update lipids- and consider ct calcium scoring if risk still high- trade off would be statins do slightly increase prediabetes  # Hyperglycemia/insulin resistance/prediabetes S:  Medication: none- had mentioned metformin in past Lab Results  Component Value Date   HGBA1C 6.1 03/25/2021   HGBA1C 6.0 (H) 05/30/2020   HGBA1C 6.3 12/16/2018   A/P: hopefully stable- update a1c today. Continue without meds for now  #allergies- not using flonase yet but will   Recommended follow up: Return in about 6 months (around 02/21/2023) for followup or sooner if needed.Schedule b4 you leave. Future Appointments  Date Time Provider [place]  09/18/2022  1:20 PM [facility] 3 [facility]   Lab/Order associations: fasting   ICD-10-CM   1.  Preventative health care  Z00.00     2. Osteoporosis with current pathological fracture, unspecified osteoporosis type, sequela  M80.00XS Vitamin D (25 hydroxy)    3. Hyperglycemia  R73.9 HgB A1c    4. Screening for diabetes mellitus  Z13.1 HgB A1c    5. Hyperlipidemia, unspecified hyperlipidemia type  E78.5 CBC with Differential/Platelet    Comprehensive metabolic panel    Lipid panel    TSH      Meds ordered this encounter  Medications   olmesartan (BENICAR) 20 MG tablet    Sig: Take 1 tablet (20 mg total) by mouth daily.    Dispense:  90 tablet    Refill:  3   hydrochlorothiazide (HYDRODIURIL) 25 MG tablet    Sig: Take 0.5 tablets (12.5 mg total) by mouth daily as needed (if blood pressure >140/90).    Dispense:  45 tablet    Refill:  0    Return precautions advised.  Garret Reddish, MD

## 2022-08-22 LAB — COMPREHENSIVE METABOLIC PANEL
ALT: 53 U/L — ABNORMAL HIGH (ref 0–35)
AST: 62 U/L — ABNORMAL HIGH (ref 0–37)
Albumin: 4.3 g/dL (ref 3.5–5.2)
Alkaline Phosphatase: 53 U/L (ref 39–117)
BUN: 15 mg/dL (ref 6–23)
CO2: 27 mEq/L (ref 19–32)
Calcium: 10.2 mg/dL (ref 8.4–10.5)
Chloride: 103 mEq/L (ref 96–112)
Creatinine, Ser: 0.9 mg/dL (ref 0.40–1.20)
GFR: 62.09 mL/min (ref >60.00)
Glucose, Bld: 89 mg/dL (ref 70–99)
Potassium: 4.1 mEq/L (ref 3.5–5.1)
Sodium: 139 mEq/L (ref 135–145)
Total Bilirubin: 0.6 mg/dL (ref 0.2–1.2)
Total Protein: 7.4 g/dL (ref 6.0–8.3)

## 2022-08-22 LAB — CBC WITH DIFFERENTIAL/PLATELET
Basophils Absolute: 0.1 10*3/uL (ref 0.0–0.1)
Basophils Relative: 0.8 % (ref 0.0–3.0)
Eosinophils Absolute: 0.1 10*3/uL (ref 0.0–0.7)
Eosinophils Relative: 1.9 % (ref 0.0–5.0)
HCT: 40.8 % (ref 36.0–46.0)
Hemoglobin: 13.6 g/dL (ref 12.0–15.0)
Lymphocytes Relative: 26.6 % (ref 12.0–46.0)
Lymphs Abs: 2 10*3/uL (ref 0.7–4.0)
MCHC: 33.4 g/dL (ref 30.0–36.0)
MCV: 92.5 fl (ref 78.0–100.0)
Monocytes Absolute: 0.6 10*3/uL (ref 0.1–1.0)
Monocytes Relative: 8.3 % (ref 3.0–12.0)
Neutro Abs: 4.7 10*3/uL (ref 1.4–7.7)
Neutrophils Relative %: 62.4 % (ref 43.0–77.0)
Platelets: 258 10*3/uL (ref 150.0–400.0)
RBC: 4.42 Mil/uL (ref 3.87–5.11)
RDW: 13.9 % (ref 11.5–15.5)
WBC: 7.5 10*3/uL (ref 4.0–10.5)

## 2022-08-22 LAB — LIPID PANEL
Cholesterol: 212 mg/dL — ABNORMAL HIGH (ref 0–200)
HDL: 42.5 mg/dL (ref >39.00)
LDL Cholesterol: 132 mg/dL — ABNORMAL HIGH (ref 0–99)
NonHDL: 169.12
Total CHOL/HDL Ratio: 5
Triglycerides: 188 mg/dL — ABNORMAL HIGH (ref 0.0–149.0)
VLDL: 37.6 mg/dL (ref 0.0–40.0)

## 2022-08-22 LAB — HEMOGLOBIN A1C: Hgb A1c MFr Bld: 6.3 % (ref 4.6–6.5)

## 2022-08-22 LAB — TSH: TSH: 1.47 u[IU]/mL (ref 0.35–5.50)

## 2022-08-22 LAB — VITAMIN D 25 HYDROXY (VIT D DEFICIENCY, FRACTURES): VITD: 28.64 ng/mL — ABNORMAL LOW (ref 30.00–100.00)

## 2022-08-31 ENCOUNTER — Encounter: Payer: Self-pay | Admitting: Family Medicine

## 2022-09-18 ENCOUNTER — Ambulatory Visit
Admission: RE | Admit: 2022-09-18 | Discharge: 2022-09-18 | Disposition: A | Discharge status: Home / Self Care | Payer: Medicare Other | Source: Ambulatory Visit | Attending: Family Medicine | Admitting: Family Medicine

## 2022-09-18 DIAGNOSIS — Z1231 Encounter for screening mammogram for malignant neoplasm of breast: Secondary | ICD-10-CM

## 2022-09-29 ENCOUNTER — Telehealth: Payer: Self-pay | Admitting: Family Medicine

## 2022-09-29 NOTE — Telephone Encounter (Signed)
Copied from CRM [id number]. Topic: Medicare AWV >> Sep 29, 2022 11:28 AM Gwenith Spitz wrote: Reason for CRM: Called patient to schedule Medicare Annual Wellness Visit (AWV). Left message for patient to call back and schedule Medicare Annual Wellness Visit (AWV).  Last date of AWV: 05/30/2021  Please schedule an appointment at any time with Inetta Fermo, [facility]. Please schedule AWVS with Inetta Fermo, [facility].  If any questions, please contact me at [phone].  Thank you ,  Gabriel Cirri [facility] AWV TEAM Direct Dial [phone]

## 2022-10-03 ENCOUNTER — Other Ambulatory Visit: Payer: Self-pay | Admitting: Family Medicine

## 2022-10-03 ENCOUNTER — Encounter: Payer: Self-pay | Admitting: Family Medicine

## 2022-10-03 ENCOUNTER — Other Ambulatory Visit: Payer: Self-pay

## 2022-10-03 MED ORDER — OLMESARTAN MEDOXOMIL 5 MG PO TABS: 10.0000 mg | ORAL_TABLET | Freq: Every day | ORAL | 11 refills | Status: DC

## 2022-10-03 MED ORDER — HYDROCHLOROTHIAZIDE 25 MG PO TABS: 12.5000 mg | ORAL_TABLET | Freq: Every day | ORAL | 0 refills | Status: DC | PRN

## 2022-11-05 DIAGNOSIS — H16223 Keratoconjunctivitis sicca, not specified as Sjogren's, bilateral: Secondary | ICD-10-CM | POA: Diagnosis not present

## 2022-11-05 DIAGNOSIS — Z961 Presence of intraocular lens: Secondary | ICD-10-CM | POA: Diagnosis not present

## 2022-11-06 ENCOUNTER — Other Ambulatory Visit: Payer: Self-pay | Admitting: Family Medicine

## 2022-11-20 ENCOUNTER — Encounter: Payer: Self-pay | Admitting: Family Medicine

## 2022-12-25 ENCOUNTER — Other Ambulatory Visit: Payer: Self-pay | Admitting: Family Medicine

## 2022-12-25 ENCOUNTER — Encounter: Payer: Self-pay | Admitting: Family Medicine

## 2022-12-25 MED ORDER — OLMESARTAN MEDOXOMIL 20 MG PO TABS: 10.0000 mg | ORAL_TABLET | Freq: Every day | ORAL | 0 refills | Status: DC

## 2022-12-26 DIAGNOSIS — L708 Other acne: Secondary | ICD-10-CM | POA: Diagnosis not present

## 2022-12-26 DIAGNOSIS — L538 Other specified erythematous conditions: Secondary | ICD-10-CM | POA: Diagnosis not present

## 2022-12-26 DIAGNOSIS — L821 Other seborrheic keratosis: Secondary | ICD-10-CM | POA: Diagnosis not present

## 2022-12-26 DIAGNOSIS — L820 Inflamed seborrheic keratosis: Secondary | ICD-10-CM | POA: Diagnosis not present

## 2022-12-26 DIAGNOSIS — L814 Other melanin hyperpigmentation: Secondary | ICD-10-CM | POA: Diagnosis not present

## 2022-12-26 DIAGNOSIS — D2371 Other benign neoplasm of skin of right lower limb, including hip: Secondary | ICD-10-CM | POA: Diagnosis not present

## 2023-01-13 ENCOUNTER — Ambulatory Visit (INDEPENDENT_AMBULATORY_CARE_PROVIDER_SITE_OTHER): Payer: Medicare Other

## 2023-01-13 VITALS — BP 112/64 | HR 68 | Temp 98.7°F | Wt 209.2 lb

## 2023-01-13 DIAGNOSIS — Z0000 Encounter for general adult medical examination without abnormal findings: Secondary | ICD-10-CM

## 2023-01-13 NOTE — Patient Instructions (Signed)
Erica Burke , Thank you for taking time to come for your Medicare Wellness Visit. I appreciate your ongoing commitment to your health goals. Please review the following plan we discussed and let me know if I can assist you in the future.   Referrals/Orders/Follow-Ups/Clinician Recommendations: continue to lose weight and eat healthy  This is a list of the screening recommended for you and due dates:  Health Maintenance  Topic Date Due   Medicare Annual Wellness Visit  05/30/2022   Flu Shot  01/15/2023   Pneumonia Vaccine  Completed   DEXA scan (bone density measurement)  Completed   Hepatitis C Screening  Completed   Zoster (Shingles) Vaccine  Completed   HPV Vaccine  Aged Out   DTaP/Tdap/Td vaccine  Discontinued   Colon Cancer Screening  Discontinued   COVID-19 Vaccine  Discontinued    Advanced directives: (Provided) Advance directive discussed with you today. I have provided a copy for you to complete at home and have notarized. Once this is complete, please bring a copy in to our office so we can scan it into your chart.   Next Medicare Annual Wellness Visit scheduled for next year: Yes  Preventive Care 77 Years and Older, Female Preventive care refers to lifestyle choices and visits with your health care provider that can promote health and wellness. What does preventive care include? A yearly physical exam. This is also called an annual well check. Dental exams once or twice a year. Routine eye exams. Ask your health care provider how often you should have your eyes checked. Personal lifestyle choices, including: Daily care of your teeth and gums. Regular physical activity. Eating a healthy diet. Avoiding tobacco and drug use. Limiting alcohol use. Practicing safe sex. Taking low-dose aspirin every day. Taking vitamin and mineral supplements as recommended by your health care provider. What happens during an annual well check? The services and screenings done by your health  care provider during your annual well check will depend on your age, overall health, lifestyle risk factors, and family history of disease. Counseling  Your health care provider may ask you questions about your: Alcohol use. Tobacco use. Drug use. Emotional well-being. Home and relationship well-being. Sexual activity. Eating habits. History of falls. Memory and ability to understand (cognition). Work and work Astronomer. Reproductive health. Screening  You may have the following tests or measurements: Height, weight, and BMI. Blood pressure. Lipid and cholesterol levels. These may be checked every 5 years, or more frequently if you are over 43 years old. Skin check. Lung cancer screening. You may have this screening every year starting at age 9 if you have a 30-pack-year history of smoking and currently smoke or have quit within the past 15 years. Fecal occult blood test (FOBT) of the stool. You may have this test every year starting at age 101. Flexible sigmoidoscopy or colonoscopy. You may have a sigmoidoscopy every 5 years or a colonoscopy every 10 years starting at age 64. Hepatitis C blood test. Hepatitis B blood test. Sexually transmitted disease (STD) testing. Diabetes screening. This is done by checking your blood sugar (glucose) after you have not eaten for a while (fasting). You may have this done every 1-3 years. Bone density scan. This is done to screen for osteoporosis. You may have this done starting at age 82. Mammogram. This may be done every 1-2 years. Talk to your health care provider about how often you should have regular mammograms. Talk with your health care provider about your test  results, treatment options, and if necessary, the need for more tests. Vaccines  Your health care provider may recommend certain vaccines, such as: Influenza vaccine. This is recommended every year. Tetanus, diphtheria, and acellular pertussis (Tdap, Td) vaccine. You may need a Td  booster every 10 years. Zoster vaccine. You may need this after age 45. Pneumococcal 13-valent conjugate (PCV13) vaccine. One dose is recommended after age 79. Pneumococcal polysaccharide (PPSV23) vaccine. One dose is recommended after age 85. Talk to your health care provider about which screenings and vaccines you need and how often you need them. This information is not intended to replace advice given to you by your health care provider. Make sure you discuss any questions you have with your health care provider. Document Released: 06/29/2015 Document Revised: 02/20/2016 Document Reviewed: 04/03/2015 Elsevier Interactive Patient Education  2017 [employer].  Fall Prevention in the Home Falls can cause injuries. They can happen to people of all ages. There are many things you can do to make your home safe and to help prevent falls. What can I do on the outside of my home? Regularly fix the edges of walkways and driveways and fix any cracks. Remove anything that might make you trip as you walk through a door, such as a raised step or threshold. Trim any bushes or trees on the path to your home. Use bright outdoor lighting. Clear any walking paths of anything that might make someone trip, such as rocks or tools. Regularly check to see if handrails are loose or broken. Make sure that both sides of any steps have handrails. Any raised decks and porches should have guardrails on the edges. Have any leaves, snow, or ice cleared regularly. Use sand or salt on walking paths during winter. Clean up any spills in your garage right away. This includes oil or grease spills. What can I do in the bathroom? Use night lights. Install grab bars by the toilet and in the tub and shower. Do not use towel bars as grab bars. Use non-skid mats or decals in the tub or shower. If you need to sit down in the shower, use a plastic, non-slip stool. Keep the floor dry. Clean up any water that spills on the floor  as soon as it happens. Remove soap buildup in the tub or shower regularly. Attach bath mats securely with double-sided non-slip rug tape. Do not have throw rugs and other things on the floor that can make you trip. What can I do in the bedroom? Use night lights. Make sure that you have a light by your bed that is easy to reach. Do not use any sheets or blankets that are too big for your bed. They should not hang down onto the floor. Have a firm chair that has side arms. You can use this for support while you get dressed. Do not have throw rugs and other things on the floor that can make you trip. What can I do in the kitchen? Clean up any spills right away. Avoid walking on wet floors. Keep items that you use a lot in easy-to-reach places. If you need to reach something above you, use a strong step stool that has a grab bar. Keep electrical cords out of the way. Do not use floor polish or wax that makes floors slippery. If you must use wax, use non-skid floor wax. Do not have throw rugs and other things on the floor that can make you trip. What can I do with my stairs?  Do not leave any items on the stairs. Make sure that there are handrails on both sides of the stairs and use them. Fix handrails that are broken or loose. Make sure that handrails are as long as the stairways. Check any carpeting to make sure that it is firmly attached to the stairs. Fix any carpet that is loose or worn. Avoid having throw rugs at the top or bottom of the stairs. If you do have throw rugs, attach them to the floor with carpet tape. Make sure that you have a light switch at the top of the stairs and the bottom of the stairs. If you do not have them, ask someone to add them for you. What else can I do to help prevent falls? Wear shoes that: Do not have high heels. Have rubber bottoms. Are comfortable and fit you well. Are closed at the toe. Do not wear sandals. If you use a stepladder: Make sure that it is  fully opened. Do not climb a closed stepladder. Make sure that both sides of the stepladder are locked into place. Ask someone to hold it for you, if possible. Clearly mark and make sure that you can see: Any grab bars or handrails. First and last steps. Where the edge of each step is. Use tools that help you move around (mobility aids) if they are needed. These include: Canes. Walkers. Scooters. Crutches. Turn on the lights when you go into a dark area. Replace any light bulbs as soon as they burn out. Set up your furniture so you have a clear path. Avoid moving your furniture around. If any of your floors are uneven, fix them. If there are any pets around you, be aware of where they are. Review your medicines with your doctor. Some medicines can make you feel dizzy. This can increase your chance of falling. Ask your doctor what other things that you can do to help prevent falls. This information is not intended to replace advice given to you by your health care provider. Make sure you discuss any questions you have with your health care provider. Document Released: 03/29/2009 Document Revised: 11/08/2015 Document Reviewed: 07/07/2014 Elsevier Interactive Patient Education  2017 [employer].

## 2023-01-13 NOTE — Progress Notes (Signed)
Subjective:   Erica Burke is a 77 y.o. female who presents for Medicare Annual (Subsequent) preventive examination.  Visit Complete: In person  Patient Medicare AWV questionnaire was completed by the patient on 01/12/23; I have confirmed that all information answered by patient is correct and no changes since this date.  Review of Systems     Cardiac Risk Factors include: advanced age (>108men, >72 women);dyslipidemia;hypertension;obesity (BMI >30kg/m2)     Objective:    Today's Vitals   01/13/23 0832  BP: 112/64  Pulse: 68  Temp: 98.7 F (37.1 C)  SpO2: 92%  Weight: 209 lb 3.2 oz (94.9 kg)   Body mass index is 32.77 kg/m.     01/13/2023    8:43 AM 05/30/2021    8:10 AM 01/01/2016    8:23 AM 01/17/2015    3:35 PM  Advanced Directives  Does Patient Have a Medical Advance Directive? No Yes Yes Yes  Type of Scientist, research (physical sciences) will Healthcare Power of [place];Living will  Copy of Healthcare Power of Attorney in Chart?  No - copy requested No - copy requested   Would patient like information on creating a medical advance directive? Yes (MAU/Ambulatory/Procedural Areas - Information given)       Current Medications (verified) Outpatient Encounter Medications as of 01/13/2023  Medication Sig   hydrochlorothiazide (HYDRODIURIL) 25 MG tablet Take 0.5 tablets (12.5 mg total) by mouth daily as needed (if blood pressure >140/90).   Multiple Vitamin (MULTIVITAMIN) capsule Take 1 capsule by mouth daily.   olmesartan (BENICAR) 20 MG tablet Take 0.5 tablets (10 mg total) by mouth daily.   [DISCONTINUED] fluticasone (FLONASE) 50 MCG/ACT nasal spray SPRAY 2 SPRAYS INTO EACH NOSTRIL EVERY DAY (Patient not taking: Reported on 08/21/2022)   [DISCONTINUED] TYRVAYA 0.03 MG/ACT SOLN Place 1 spray into both nostrils 2 (two) times daily.   No facility-administered encounter medications on file as of 01/13/2023.    Allergies (verified) Tetanus-diphtheria toxoids  td, Tetanus-diphtheria toxoids td, and Codeine sulfate   History: Past Medical History:  Diagnosis Date   Allergy    mild    Anemia    past hx - 20's early 30's   Cataract    removed bilat   Hyperlipidemia    Hypertension    Sleep apnea    uses cpap   Thyroid disease    in her 20's- short period    TRANSAMINASES, SERUM, ELEVATED 10/19/2008   Mild elevations - resolved     Past Surgical History:  Procedure Laterality Date   ABDOMINAL HYSTERECTOMY     severe bleeding, still has ovaries   cataract surgery     bilateral   COLONOSCOPY  07/24/2020   01/31/15- [facility]ings   FLEXIBLE SIGMOIDOSCOPY  2001   POLYPECTOMY     SIGMOIDOSCOPY     Family History  Problem Relation Age of Onset   Stroke Mother    Stroke Father    Hypertension Other    Lung cancer Brother        x2 smokers   Scleroderma Brother        "died from thrombosis"   Other Sister        spinal muscular atrophy- 1 sister, 2 brothers   Colon cancer Neg Hx    Colon polyps Neg Hx    Esophageal cancer Neg Hx    Rectal cancer Neg Hx    Stomach cancer Neg Hx    Social History   Socioeconomic History  Marital status: Married    Spouse name: Not on file   Number of children: Not on file   Years of education: Not on file   Highest education level: Not on file  Occupational History   Not on file  Tobacco Use   Smoking status: Never   Smokeless tobacco: Never  Substance and Sexual Activity   Alcohol use: No    Alcohol/week: 0.0 standard drinks of alcohol   Drug use: No   Sexual activity: Not on file  Other Topics Concern   Not on file  Social History Narrative   Married (49 years in 2016[facility] patient of Dr. Durene Cal). 3 children. 5 grandkids.       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      Hobbies: time with grandkids, [place]   Social Determinants of Health   Financial Resource Strain: Low Risk  (01/12/2023)   Overall Financial Resource Strain (CARDIA)    Difficulty  of Paying Living Expenses: Not hard at all  Food Insecurity: No Food Insecurity (01/12/2023)   Hunger Vital Sign    Worried About Running Out of Food in the Last Year: Never true    Ran Out of Food in the Last Year: Never true  Transportation Needs: No Transportation Needs (01/12/2023)   PRAPARE - Administrator, Civil Service (Medical): No    Lack of Transportation (Non-Medical): No  Physical Activity: Sufficiently Active (01/12/2023)   Exercise Vital Sign    Days of Exercise per Week: 5 days    Minutes of Exercise per Session: 40 min  Stress: No Stress Concern Present (01/12/2023)   [employer] of Occupational Health - Occupational Stress Questionnaire    Feeling of Stress : Not at all  Social Connections: Socially Integrated (01/12/2023)   Social Connection and Isolation Panel [NHANES]    Frequency of Communication with Friends and Family: More than three times a week    Frequency of Social Gatherings with Friends and Family: More than three times a week    Attends Religious Services: More than 4 times per year    Active Member of [employer] or Organizations: Yes    Attends Engineer, structural: More than 4 times per year    Marital Status: Married    Tobacco Counseling Counseling given: Not Answered   Clinical Intake:  Pre-visit preparation completed: Yes  Pain : No/denies pain     BMI - recorded: 32.77 Nutritional Status: BMI > 30  Obese Nutritional Risks: None Diabetes: No  How often do you need to have someone help you when you read instructions, pamphlets, or other written materials from your doctor or pharmacy?: 1 - Never  Interpreter Needed?: No  Information entered by :: Lanier Ensign, LPN   Activities of Daily Living    01/12/2023    5:33 PM  In your present state of health, do you have any difficulty performing the following activities:  Hearing? 1  Comment wears hearing aids  Vision? 1  Difficulty concentrating or making  decisions? 0  Walking or climbing stairs? 0  Dressing or bathing? 0  Doing errands, shopping? 0  Preparing Food and eating ? N  Using the Toilet? N  In the past six months, have you accidently leaked urine? N  Do you have problems with loss of bowel control? N  Managing your Medications? N  Managing your Finances? N  Housekeeping or managing your Housekeeping? N    Patient Care Team: Tana Conch  Val Eagle, MD as PCP - General (Family Medicine)  Indicate any recent Medical Services you may have received from other than [facility] providers in the past year (date may be approximate).     Assessment:   This is a routine wellness examination for Beecher.  Hearing/Vision screen Hearing Screening - Comments:: Pt wears hearing aids  Vision Screening - Comments:: Pt follows up with [facility] for annual eye exams   Dietary issues and exercise activities discussed:     Goals Addressed             This Visit's Progress    Patient Stated       Continue to lose weight        Depression Screen    01/13/2023    8:42 AM 08/21/2022    2:03 PM 05/30/2021    8:09 AM 03/25/2021    3:54 PM 05/30/2020    9:42 AM 12/13/2018    3:41 PM 03/04/2017    8:46 AM  PHQ 2/9 Scores  PHQ - 2 Score 0 0 0 0 0 0 0  PHQ- 9 Score  2         Fall Risk    01/12/2023    5:33 PM 08/21/2022    2:02 PM 05/30/2021    8:12 AM 03/25/2021    3:54 PM 06/30/2019    9:25 AM  Fall Risk   Falls in the past year? 0 0 0 0 0  Number falls in past yr:   0 0   Injury with Fall?  0 0 0   Risk for fall due to : Impaired vision No Fall Risks  No Fall Risks   Follow up Falls prevention discussed Falls evaluation completed Falls prevention discussed Falls evaluation completed     MEDICARE RISK AT HOME:   TIMED UP AND GO:  Was the test performed?  Yes  Length of time to ambulate 10 feet: 10 sec Gait steady and fast without use of assistive device    Cognitive Function:        01/13/2023    8:45 AM 05/30/2021    8:13  AM  6CIT Screen  What Year? 0 points 0 points  What month? 0 points 0 points  What time? 0 points 0 points  Count back from 20 0 points 0 points  Months in reverse 0 points 0 points  Repeat phrase 0 points 0 points  Total Score 0 points 0 points    Immunizations Immunization History  Administered Date(s) Administered   Influenza-Unspecified 03/16/2014   Moderna Sars-Covid-2 Vaccination 07/19/2019, 08/17/2019   Pneumococcal Conjugate-13 12/01/2014   Pneumococcal Polysaccharide-23 12/17/2011   Zoster Recombinant(Shingrix) 12/13/2018, 02/10/2019   Zoster, Live 12/17/2011      Flu Vaccine status: Declined, Education has been provided regarding the importance of this vaccine but patient still declined. Advised may receive this vaccine at local pharmacy or Health Dept. Aware to provide a copy of the vaccination record if obtained from local pharmacy or Health Dept. Verbalized acceptance and understanding.  Pneumococcal vaccine status: Up to date  Covid-19 vaccine status: Declined, Education has been provided regarding the importance of this vaccine but patient still declined. Advised may receive this vaccine at local pharmacy or Health Dept.or vaccine clinic. Aware to provide a copy of the vaccination record if obtained from local pharmacy or Health Dept. Verbalized acceptance and understanding.  Qualifies for Shingles Vaccine? Yes   Zostavax completed Yes   Shingrix Completed?: Yes  Screening Tests Health Maintenance  Topic Date Due   INFLUENZA VACCINE  01/15/2023   Medicare Annual Wellness (AWV)  01/13/2024   Pneumonia Vaccine 109+ Years old  Completed   DEXA SCAN  Completed   Hepatitis C Screening  Completed   Zoster Vaccines- Shingrix  Completed   HPV VACCINES  Aged Out   DTaP/Tdap/Td  Discontinued   Colonoscopy  Discontinued   COVID-19 Vaccine  Discontinued    Health Maintenance  There are no preventive care reminders to display for this patient.   Colorectal  cancer screening: No longer required.   Mammogram status: Completed 09/19/22. Repeat every year  Bone Density status: Completed 01/07/19. Results reflect: Bone density results: OSTEOPENIA. Repeat every 2 years.    Additional Screening:  Hepatitis C Screening:  Completed 10/19/08  Vision Screening: Recommended annual ophthalmology exams for early detection of glaucoma and other disorders of the eye. Is the patient up to date with their annual eye exam?  Yes  Who is the provider or what is the name of the office in which the patient attends annual eye exams? [facility]  If pt is not established with a provider, would they like to be referred to a provider to establish care? No .   Dental Screening: Recommended annual dental exams for proper oral hygiene   Community Resource Referral / Chronic Care Management: CRR required this visit?  No   CCM required this visit?  No     Plan:     I have personally reviewed and noted the following in the patient's chart:   Medical and social history Use of alcohol, tobacco or illicit drugs  Current medications and supplements including opioid prescriptions. Patient is not currently taking opioid prescriptions. Functional ability and status Nutritional status Physical activity Advanced directives List of other physicians Hospitalizations, surgeries, and ER visits in previous 12 months Vitals Screenings to include cognitive, depression, and falls Referrals and appointments  In addition, I have reviewed and discussed with patient certain preventive protocols, quality metrics, and best practice recommendations. A written personalized care plan for preventive services as well as general preventive health recommendations were provided to patient.     Marzella Schlein, LPN   [id number]   After Visit Summary: (MyChart) Due to this being a telephonic visit, the after visit summary with patients personalized plan was offered to patient via MyChart    Nurse Notes: none

## 2023-01-29 ENCOUNTER — Encounter (INDEPENDENT_AMBULATORY_CARE_PROVIDER_SITE_OTHER): Payer: Self-pay

## 2023-01-30 ENCOUNTER — Emergency Department (HOSPITAL_BASED_OUTPATIENT_CLINIC_OR_DEPARTMENT_OTHER): Payer: Medicare Other

## 2023-01-30 ENCOUNTER — Emergency Department (HOSPITAL_BASED_OUTPATIENT_CLINIC_OR_DEPARTMENT_OTHER)
Admission: EM | Admit: 2023-01-30 | Discharge: 2023-01-30 | Disposition: A | Discharge status: Home / Self Care | Payer: Medicare Other | Attending: Emergency Medicine | Admitting: Emergency Medicine

## 2023-01-30 ENCOUNTER — Other Ambulatory Visit: Payer: Self-pay

## 2023-01-30 ENCOUNTER — Encounter (HOSPITAL_BASED_OUTPATIENT_CLINIC_OR_DEPARTMENT_OTHER): Payer: Self-pay

## 2023-01-30 DIAGNOSIS — W010XXA Fall on same level from slipping, tripping and stumbling without subsequent striking against object, initial encounter: Secondary | ICD-10-CM | POA: Diagnosis not present

## 2023-01-30 DIAGNOSIS — S01511A Laceration without foreign body of lip, initial encounter: Secondary | ICD-10-CM | POA: Diagnosis not present

## 2023-01-30 DIAGNOSIS — R220 Localized swelling, mass and lump, head: Secondary | ICD-10-CM | POA: Diagnosis not present

## 2023-01-30 DIAGNOSIS — S0083XA Contusion of other part of head, initial encounter: Secondary | ICD-10-CM | POA: Diagnosis not present

## 2023-01-30 DIAGNOSIS — I6782 Cerebral ischemia: Secondary | ICD-10-CM | POA: Diagnosis not present

## 2023-01-30 DIAGNOSIS — I10 Essential (primary) hypertension: Secondary | ICD-10-CM | POA: Insufficient documentation

## 2023-01-30 DIAGNOSIS — W19XXXA Unspecified fall, initial encounter: Secondary | ICD-10-CM

## 2023-01-30 DIAGNOSIS — S022XXA Fracture of nasal bones, initial encounter for closed fracture: Secondary | ICD-10-CM

## 2023-01-30 NOTE — Discharge Instructions (Signed)
You were seen in the emergency department today after a fall.  You did break your nose but the back do not badly displaced.  You should apply a cool compress and with the swelling go down.  You can discuss this further with your primary care doctor but I have listed the name of an ENT/plastic surgeon if the nose continues to bother you either cosmetically or with breathing they can talk to you about options for repair.

## 2023-01-30 NOTE — ED Provider Notes (Signed)
Emergency Department Provider Note   I have reviewed the triage vital signs and the nursing notes.   HISTORY  Chief Complaint Fall and Facial Injury   HPI Erica Burke is a 77 y.o. female with past history reviewed below presents emergency department after a fall.  She tripped on a rug falling forward with head and face trauma.  No loss of consciousness.  No numbness or weakness.  No vision changes.  She had bleeding which she was able to control with direct pressure.  Her teeth do not feel out of place or loose.  Denies any chest pain.  No previous syncope symptoms prior to fall. She is unable to receive the tetanus vaccine due to a reaction in the 1970s.    Past Medical History:  Diagnosis Date   Allergy    mild    Anemia    past hx - 20's early 30's   Cataract    removed bilat   Hyperlipidemia    Hypertension    Sleep apnea    uses cpap   Thyroid disease    in her 20's- short period    TRANSAMINASES, SERUM, ELEVATED 10/19/2008   Mild elevations - resolved      Review of Systems  Constitutional: No fever/chills Eyes: No visual changes. ENT: Positive nose pain and bleeding.  Cardiovascular: Denies chest pain. Respiratory: Denies shortness of breath. Gastrointestinal: No abdominal pain.  No nausea, no vomiting.   Genitourinary: Negative for dysuria. Musculoskeletal: Negative for back pain. Skin: Negative for rash. Neurological: Negative for headaches. No weakness/numbness.   ____________________________________________   PHYSICAL EXAM:  VITAL SIGNS: ED Triage Vitals  Encounter Vitals Group     BP 01/30/23 1145 (!) 153/73     Pulse Rate 01/30/23 1145 (!) 46     Resp 01/30/23 1145 17     Temp 01/30/23 1145 (!) 96 F (35.6 C)     Temp src --      SpO2 01/30/23 1145 97 %     Weight 01/30/23 1145 207 lb 3.7 oz (94 kg)     Height 01/30/23 1145 5\' 7"  (1.702 m)   Constitutional: Alert and oriented. Well appearing and in no acute distress. Eyes: Conjunctivae  are normal. PERRL. EOMI. Head: Atraumatic. Nose: No congestion/rhinnorhea. No septal hematoma. Swelling to the bridge of the nose with abrasions. No lacerations.  Mouth/Throat: Mucous membranes are moist.  Oropharynx non-erythematous. Superficial laceration to the mucosal surface of the upper lip. No [place] boarder involvement.  Neck: No stridor.  No cervical spine tenderness to palpation. Cardiovascular: Normal rate, regular rhythm. Good peripheral circulation. Grossly normal heart sounds.   Respiratory: Normal respiratory effort.  No retractions. Lungs CTAB. Gastrointestinal: Soft and nontender. No distention.  Musculoskeletal: No lower extremity tenderness nor edema. No gross deformities of extremities. Neurologic:  Normal speech and language. No gross focal neurologic deficits are appreciated.  Skin:  Skin is warm, dry and intact. No rash noted. ______________________________________  RADIOLOGY  No results found.  ____________________________________________   PROCEDURES  Procedure(s) performed:   Procedures   ____________________________________________   INITIAL IMPRESSION / ASSESSMENT AND PLAN / ED COURSE  Pertinent labs & imaging results that were available during my care of the patient were reviewed by me and considered in my medical decision making (see chart for details).   This patient is Presenting for Evaluation of head injury, which does require a range of treatment options, and is a complaint that involves a high risk of morbidity and mortality.  The Differential Diagnoses includes subdural hematoma, epidural hematoma, acute concussion, traumatic subarachnoid hemorrhage, cerebral contusions, etc.   Radiologic Tests Ordered, included CT head, c spine, and max/face. I independently interpreted the images and agree with radiology interpretation.   Medical Decision Making: Summary:  Patient presents for evaluation after fall.  Patient likely has a nasal bone  fracture although not significantly displaced here.  She does have some swelling.  No extraocular entrapment. Do plan for CT imaging. No indication for laceration repair with suture.   Reevaluation with update and discussion with   ***Considered admission***  Patient's presentation is most consistent with acute presentation with potential threat to life or bodily function.   Disposition:   ____________________________________________  FINAL CLINICAL IMPRESSION(S) / ED DIAGNOSES  Final diagnoses:  None     NEW OUTPATIENT MEDICATIONS STARTED DURING THIS VISIT:  New Prescriptions   No medications on file    Note:  This document was prepared using Dragon voice recognition software and may include unintentional dictation errors.  Alona Bene, MD, [facility]

## 2023-01-30 NOTE — ED Triage Notes (Addendum)
The patient stated she tripped over a rug. She denied LOC. She has bruising and abrasion to her face, nose and upper lip. She denied blood thinner use. Patient stated her heart rate is normally in the upper 40's.

## 2023-02-24 ENCOUNTER — Encounter: Payer: Self-pay | Admitting: Family Medicine

## 2023-02-24 ENCOUNTER — Ambulatory Visit (INDEPENDENT_AMBULATORY_CARE_PROVIDER_SITE_OTHER): Payer: Medicare Other | Admitting: Family Medicine

## 2023-02-24 ENCOUNTER — Telehealth: Payer: Self-pay | Admitting: *Deleted

## 2023-02-24 VITALS — BP 120/80 | HR 60 | Temp 97.7°F | Ht 67.0 in | Wt 203.4 lb

## 2023-02-24 DIAGNOSIS — I10 Essential (primary) hypertension: Secondary | ICD-10-CM | POA: Diagnosis not present

## 2023-02-24 DIAGNOSIS — E785 Hyperlipidemia, unspecified: Secondary | ICD-10-CM

## 2023-02-24 DIAGNOSIS — R739 Hyperglycemia, unspecified: Secondary | ICD-10-CM

## 2023-02-24 DIAGNOSIS — E559 Vitamin D deficiency, unspecified: Secondary | ICD-10-CM

## 2023-02-24 DIAGNOSIS — Z131 Encounter for screening for diabetes mellitus: Secondary | ICD-10-CM

## 2023-02-24 LAB — HEMOGLOBIN A1C: Hgb A1c MFr Bld: 6.1 % (ref 4.6–6.5)

## 2023-02-24 LAB — LIPID PANEL
Cholesterol: 228 mg/dL — ABNORMAL HIGH (ref 0–200)
HDL: 49.6 mg/dL (ref >39.00)
LDL Cholesterol: 145 mg/dL — ABNORMAL HIGH (ref 0–99)
NonHDL: 178.53
Total CHOL/HDL Ratio: 5
Triglycerides: 168 mg/dL — ABNORMAL HIGH (ref 0.0–149.0)
VLDL: 33.6 mg/dL (ref 0.0–40.0)

## 2023-02-24 LAB — VITAMIN D 25 HYDROXY (VIT D DEFICIENCY, FRACTURES): VITD: 110.43 ng/mL (ref 30.00–100.00)

## 2023-02-24 LAB — COMPREHENSIVE METABOLIC PANEL
ALT: 23 U/L (ref 0–35)
AST: 25 U/L (ref 0–37)
Albumin: 4.4 g/dL (ref 3.5–5.2)
Alkaline Phosphatase: 55 U/L (ref 39–117)
BUN: 15 mg/dL (ref 6–23)
CO2: 31 meq/L (ref 19–32)
Calcium: 10.4 mg/dL (ref 8.4–10.5)
Chloride: 101 meq/L (ref 96–112)
Creatinine, Ser: 0.76 mg/dL (ref 0.40–1.20)
GFR: 75.78 mL/min (ref >60.00)
Glucose, Bld: 92 mg/dL (ref 70–99)
Potassium: 4 meq/L (ref 3.5–5.1)
Sodium: 140 meq/L (ref 135–145)
Total Bilirubin: 0.6 mg/dL (ref 0.2–1.2)
Total Protein: 8 g/dL (ref 6.0–8.3)

## 2023-02-24 MED ORDER — OLMESARTAN MEDOXOMIL 5 MG PO TABS: 5.0000 mg | ORAL_TABLET | Freq: Every day | ORAL | 3 refills | Status: DC

## 2023-02-24 NOTE — Telephone Encounter (Signed)
CRITICAL VALUE STICKER  CRITICAL VALUE: Vit D=110.43  RECEIVER (on-site recipient of call): Sherronda Sweigert  DATE & TIME NOTIFIED: 02/24/23 at 3:36  MESSENGER (representative from lab):  MD NOTIFIED: Tana Conch, MD  TIME OF NOTIFICATION: 3:39  RESPONSE: discussed verbally with Dr. Durene Cal and he will respond and put his recommendation in the lab result

## 2023-02-24 NOTE — Telephone Encounter (Signed)
Spoke with patient-she is going to hold her vitamin D for 2 months and let me know tomorrow her dose of vitamin D

## 2023-02-24 NOTE — Progress Notes (Signed)
Phone [phone] In person visit   Subjective:   Erica Burke is a 77 y.o. year old very pleasant female patient who presents for/with See problem oriented charting Chief Complaint  Patient presents with   Hypertension   Hyperlipidemia   Past Medical History-  Patient Active Problem List   Diagnosis Date Noted   Osteoporosis 03/04/2017    Priority: High   Hyperglycemia 12/01/2014    Priority: Medium    Essential hypertension 10/19/2008    Priority: Medium    Hyperlipidemia 01/25/2007    Priority: Medium    Closed fracture of one rib of right side 06/24/2017    Priority: Low   Closed fracture of right wrist 06/24/2017    Priority: Low   Polyp of colon, adenomatous 02/06/2015    Priority: Low   OSA on CPAP 07/11/2008    Priority: Low   Medications- reviewed and updated Current Outpatient Medications  Medication Sig Dispense Refill   hydrochlorothiazide (HYDRODIURIL) 25 MG tablet Take 0.5 tablets (12.5 mg total) by mouth daily as needed (if blood pressure >140/90). 45 tablet 0   Multiple Vitamin (MULTIVITAMIN) capsule Take 1 capsule by mouth daily.     olmesartan (BENICAR) 5 MG tablet Take 1 tablet (5 mg total) by mouth daily. 90 tablet 3   No current facility-administered medications for this visit.     Objective:  BP 120/80   Pulse 60   Temp 97.7 F (36.5 C)   Ht 5\' 7"  (1.702 m)   Wt 203 lb 6.4 oz (92.3 kg)   SpO2 96%   BMI 31.86 kg/m  Gen: NAD, resting comfortably CV: RRR no murmurs rubs or gallops Lungs: CTAB no crackles, wheeze, rhonchi Ext: no edema Skin: warm, dry     Assessment and Plan   # Emergency department visit for fall on 01/30/2023-tripped over a rug and fell and hit her face-she did have a nasal bone fracture but no acute bleeds or other acute issues on CT head, cervical spine, maxillofacial-she was given ENT contact but was told that since no displacement if felt better did not have to see them and she felt much better in 2 weeks.  - she  reports rug she tripped on is no longer there. Not putting herself in that position again.    #hypertension S: medication: Olmesartan 10 mg (half of 20 mg tablet) right now but this was due to issue where they would not fill the 5 mg and she wants to go back down- her blood pressure had been controlled at 121/65 on home readings on the 5 mg in june, hydrochlorothiazide 12.5 mg if blood pressure over 140/90 -still walking daily. For plantar fascia- used pure castor oil and places on bottom of feet and has done much better after a few months.  BP Readings from Last 3 Encounters:  02/24/23 120/80  01/30/23 (!) 143/72  01/13/23 112/64  A/P: stable- continue current medicines other than go back down to olmesartan 5 mg  #hyperlipidemia S: Medication:none  The 10-year ASCVD risk score (Arnett DK, et al., 2019) is: 22.4%  Lab Results  Component Value Date   CHOL 212 (H) 08/21/2022   HDL 42.50 08/21/2022   LDLCALC 132 (H) 08/21/2022   LDLDIRECT 145.0 03/25/2021   TRIG 188.0 (H) 08/21/2022   CHOLHDL 5 08/21/2022   A/P: cholesterol is mild to moderately elevated - she wants to continue to work healthy eating and regular exercise and recheck this at physical over starting statin or CT calcium scoring  even with elevated risk. Mom and dad did have stroke in 38's so some family history -other thing to note is that with prediabetes - statin could increase diabetes risk    # Hyperglycemia/insulin resistance/prediabetes-peak A1c at 6.3 in 2024 S:  Medication: none Exercise and diet- walking daily 2 miles. Down 22 lbs from April- cut out sugar, flour, dairy for the most part Lab Results  Component Value Date   HGBA1C 6.3 08/21/2022   HGBA1C 6.1 03/25/2021   HGBA1C 6.0 (H) 05/30/2020  A/P: has done incredible job with weight loss - we are hoping for improvement on a1c today  #Vitamin D deficiency S: Medication: taking 3 1000 units she believes Last vitamin D Lab Results  Component Value Date    VD25OH 28.64 (L) 08/21/2022  A/P: hopefully improved- update vitamin D today. Continue current meds for now     #Elevated LFts- wonder about fatty liver- with weight loss hoping for improvement Lab Results  Component Value Date   ALT 53 (H) 08/21/2022   AST 62 (H) 08/21/2022   ALKPHOS 53 08/21/2022   BILITOT 0.6 08/21/2022   #osteoporosis- wants to do bone density in march- is doing great job with weight bearing exercise. Some missed doses calicum but doing good job with D   Recommended follow up: No follow-ups on file. Future Appointments  Date Time Provider Department Center  08/25/2023  8:00 AM Shelva Majestic, MD [facility]  01/19/2024  8:45 AM [facility] ANNUAL WELLNESS VISIT 1 [facility]   Lab/Order associations: fasting   ICD-10-CM   1. Essential hypertension  I10 Comprehensive metabolic panel    2. Hyperlipidemia, unspecified hyperlipidemia type  E78.5 Comprehensive metabolic panel    Lipid panel    3. Hyperglycemia  R73.9 HgB A1c    4. Screening for diabetes mellitus  Z13.1 HgB A1c    5. Vitamin D deficiency  E55.9 VITAMIN D 25 Hydroxy (Vit-D Deficiency, Fractures)     Meds ordered this encounter  Medications   olmesartan (BENICAR) 5 MG tablet    Sig: Take 1 tablet (5 mg total) by mouth daily.    Dispense:  90 tablet    Refill:  3    Wait to fill until patient calls    Return precautions advised.  Tana Conch, MD

## 2023-02-24 NOTE — Patient Instructions (Addendum)
Please stop by lab before you go If you have mychart- we will send your results within 3 business days of [place] receiving them.  If you do not have mychart- we will call you about results within 5 business days of [place] receiving them.  *please also note that you will see labs on mychart as soon as they post. I will later go in and write notes on them- will say "notes from Dr. Durene Cal"   Congratulations on all your hard work!   Also thrilled no more substantial injury with the fall and that you have healed so well  Recommended follow up: Return for next already scheduled visit or sooner if needed.

## 2023-02-25 ENCOUNTER — Encounter: Payer: Self-pay | Admitting: Family Medicine

## 2023-04-21 ENCOUNTER — Other Ambulatory Visit: Payer: Self-pay | Admitting: Family Medicine

## 2023-04-24 ENCOUNTER — Encounter: Payer: Self-pay | Admitting: Family Medicine

## 2023-04-27 ENCOUNTER — Other Ambulatory Visit: Payer: Self-pay

## 2023-04-27 MED ORDER — OLMESARTAN MEDOXOMIL 20 MG PO TABS: 20.0000 mg | ORAL_TABLET | Freq: Every day | ORAL | 3 refills | Status: DC

## 2023-05-11 DIAGNOSIS — H43813 Vitreous degeneration, bilateral: Secondary | ICD-10-CM | POA: Diagnosis not present

## 2023-05-11 DIAGNOSIS — H16223 Keratoconjunctivitis sicca, not specified as Sjogren's, bilateral: Secondary | ICD-10-CM | POA: Diagnosis not present

## 2023-05-11 DIAGNOSIS — H35371 Puckering of macula, right eye: Secondary | ICD-10-CM | POA: Diagnosis not present

## 2023-05-11 DIAGNOSIS — D3131 Benign neoplasm of right choroid: Secondary | ICD-10-CM | POA: Diagnosis not present

## 2023-05-11 DIAGNOSIS — Z961 Presence of intraocular lens: Secondary | ICD-10-CM | POA: Diagnosis not present

## 2023-07-15 NOTE — Telephone Encounter (Signed)
Please contact patient to schedule visit

## 2023-08-25 ENCOUNTER — Encounter: Payer: Medicare Other | Admitting: Family Medicine

## 2023-11-12 ENCOUNTER — Encounter: Payer: Self-pay | Admitting: Family Medicine

## 2023-11-19 ENCOUNTER — Encounter: Payer: Self-pay | Admitting: Sleep Medicine

## 2023-11-19 ENCOUNTER — Ambulatory Visit: Admitting: Sleep Medicine

## 2023-11-19 VITALS — BP 120/80 | HR 65 | Temp 97.6°F | Ht 66.0 in | Wt 212.4 lb

## 2023-11-19 DIAGNOSIS — Z6834 Body mass index (BMI) 34.0-34.9, adult: Secondary | ICD-10-CM | POA: Diagnosis not present

## 2023-11-19 DIAGNOSIS — E66811 Obesity, class 1: Secondary | ICD-10-CM

## 2023-11-19 DIAGNOSIS — G4733 Obstructive sleep apnea (adult) (pediatric): Secondary | ICD-10-CM | POA: Diagnosis not present

## 2023-11-19 DIAGNOSIS — I10 Essential (primary) hypertension: Secondary | ICD-10-CM | POA: Diagnosis not present

## 2023-11-19 NOTE — Patient Instructions (Signed)

## 2023-11-19 NOTE — Progress Notes (Signed)
 Name:Erica Burke MRN: [id number] DOB: 02-09-1946   CHIEF COMPLAINT:  CPAP F/U   HISTORY OF PRESENT ILLNESS:  Erica Burke is a 78 y.o. w/ a h/o OSA and HTN who present for CPAP F/U visit. Reports using CPAP therapy every night, which is confirmed by compliance data. Reports using the Airfit P10 nasal pillow, which causes air leaks. States that her CPAP device has malfunctioned and is no longer shutting off after use.   Reports occasional nocturnal awakenings due to unclear reasons, however does not have difficulty falling back to sleep. Reports a 20 lb weight loss over the last few years. Denies morning headaches, RLS symptoms, dream enactment, cataplexy, hypnagogic or hypnapompic hallucinations. Denies a family history of sleep apnea. Denies drowsy driving. Drinks 1-2 cups of decaf coffee daily, denies alcohol, tobacco or illicit drug use.   Bedtime 8:30-9 pm Sleep onset 10 mins Rise time 5:30 am   EPWORTH SLEEP SCORE      No data to display          PAST MEDICAL HISTORY :   has a past medical history of Allergy, Anemia, Cataract, Hyperlipidemia, Hypertension, Sleep apnea, Thyroid  disease, and TRANSAMINASES, SERUM, ELEVATED (10/19/2008).  has a past surgical history that includes Abdominal hysterectomy; Flexible sigmoidoscopy (2001); cataract surgery; Polypectomy; Sigmoidoscopy; and Colonoscopy (07/24/2020). Prior to Admission medications   Medication Sig Start Date End Date Taking? Authorizing Provider  hydrochlorothiazide  (HYDRODIURIL ) 25 MG tablet TAKE 0.5 TABLETS (12.5 MG TOTAL) BY MOUTH DAILY AS NEEDED (IF BLOOD PRESSURE >140/90). 04/21/23  Yes Almira Jaeger, MD  Multiple Vitamin (MULTIVITAMIN) capsule Take 1 capsule by mouth daily.   Yes [provider]  olmesartan  (BENICAR ) 5 MG tablet Take 5 mg by mouth daily.   Yes [provider]   Allergies  Allergen Reactions   Tetanus-Diphtheria Toxoids Td     Severe rash, fever, hospitalized.    Codeine  Sulfate     REACTION: unspecified    FAMILY HISTORY:  family history includes Hypertension in an other family member; Lung cancer in her brother; Other in her sister; Scleroderma in her brother; Stroke in her father and mother. SOCIAL HISTORY:  reports that she has never smoked. She has never used smokeless tobacco. She reports that she does not drink alcohol and does not use drugs.   Review of Systems:  Gen:  Denies  fever, sweats, chills weight loss  HEENT: Denies blurred vision, double vision, ear pain, eye pain, hearing loss, nose bleeds, sore throat Cardiac:  No dizziness, chest pain or heaviness, chest tightness,edema, No JVD Resp:   No cough, -sputum production, -shortness of breath,-wheezing, -hemoptysis,  Gi: Denies swallowing difficulty, stomach pain, nausea or vomiting, diarrhea, constipation, bowel incontinence Gu:  Denies bladder incontinence, burning urine Ext:   Denies Joint pain, stiffness or swelling Skin: Denies  skin rash, easy bruising or bleeding or hives Endoc:  Denies polyuria, polydipsia , polyphagia or weight change Psych:   Denies depression, insomnia or hallucinations  Other:  All other systems negative  VITAL SIGNS: BP 120/80 (BP Location: Left Arm, Patient Position: Sitting, Cuff Size: Large)   Pulse 65   Temp 97.6 F (36.4 C) (Oral)   Ht 5\' 6"  (1.676 m)   Wt 212 lb 6.4 oz (96.3 kg)   SpO2 95%   BMI 34.28 kg/m    Physical Examination:   General Appearance: No distress  EYES PERRLA, EOM intact.   NECK Supple, No JVD Pulmonary: normal breath sounds, No  wheezing.  CardiovascularNormal S1,S2.  No m/r/g.   Abdomen: Benign, Soft, non-tender. Skin:   warm, no rashes, no ecchymosis  Extremities: normal, no cyanosis, clubbing. Neuro:without focal findings,  speech normal  PSYCHIATRIC: Mood, affect within normal limits.   ASSESSMENT AND PLAN  OSA Patient is using and benefiting from CPAP therapy. Due to her CPAP device malfunctioning, will send  order for new CPAP device set to 4-18 cm H2O. Discussed the consequences of untreated sleep apnea. Advised not to drive drowsy for safety of patient and others. Will follow up in 3 months.   HTN Stable, on current management. Following with PCP.   Obesity Counseled patient on diet and lifestyle modification.    Patient  satisfied with Plan of action and management. All questions answered  I spent a total of 52 minutes reviewing chart data, face-to-face evaluation with the patient, counseling and coordination of care as detailed above.    Tighe Gitto, M.D.  Sleep Medicine [facility]

## 2023-12-15 ENCOUNTER — Other Ambulatory Visit: Payer: Self-pay | Admitting: Family Medicine

## 2023-12-15 DIAGNOSIS — Z1231 Encounter for screening mammogram for malignant neoplasm of breast: Secondary | ICD-10-CM

## 2023-12-29 DIAGNOSIS — D2371 Other benign neoplasm of skin of right lower limb, including hip: Secondary | ICD-10-CM | POA: Diagnosis not present

## 2023-12-29 DIAGNOSIS — L538 Other specified erythematous conditions: Secondary | ICD-10-CM | POA: Diagnosis not present

## 2023-12-29 DIAGNOSIS — L821 Other seborrheic keratosis: Secondary | ICD-10-CM | POA: Diagnosis not present

## 2023-12-29 DIAGNOSIS — L814 Other melanin hyperpigmentation: Secondary | ICD-10-CM | POA: Diagnosis not present

## 2023-12-29 DIAGNOSIS — L820 Inflamed seborrheic keratosis: Secondary | ICD-10-CM | POA: Diagnosis not present

## 2024-01-04 ENCOUNTER — Ambulatory Visit: Admitting: Nurse Practitioner

## 2024-01-05 ENCOUNTER — Encounter

## 2024-01-05 ENCOUNTER — Ambulatory Visit
Admission: RE | Admit: 2024-01-05 | Discharge: 2024-01-05 | Disposition: A | Discharge status: Home / Self Care | Source: Ambulatory Visit | Attending: Family Medicine | Admitting: Family Medicine

## 2024-01-05 DIAGNOSIS — Z1231 Encounter for screening mammogram for malignant neoplasm of breast: Secondary | ICD-10-CM | POA: Diagnosis not present

## 2024-01-07 ENCOUNTER — Telehealth: Payer: Self-pay

## 2024-01-07 NOTE — Telephone Encounter (Signed)
Rodena Piety can you check on this? Thank you!

## 2024-01-07 NOTE — Telephone Encounter (Signed)
 Copied from CRM [id number]. Topic: Clinical - Order For Equipment >> Jan 07, 2024  2:23 PM Erica Burke wrote: Reason for CRM: Patient is calling to confirm documents were received for her CPAP supplies - states it was missing Burke signature. Requesting Burke call back with any updates.   Callback number: [phone]

## 2024-01-12 NOTE — Telephone Encounter (Signed)
 I called [facility] they had the notes and sleep study that I had faxed to them but they didn't have the signed order by Dr. Jess. They had faxed the CMN order to fax # [phone] which I think maybe Dr. Reddy's old fax #. I gave Wanda the correct fax # [phone] to refax the order to Dr. Jess to sign. I have spoke with Mrs. Christianson letting her know the update. We are just waiting for the CMN for Dr. Jess to sign

## 2024-01-18 NOTE — Telephone Encounter (Signed)
 Per Melissa, CMN has been faxed back and she has received a confirmation.  Nothing further needed.

## 2024-01-19 ENCOUNTER — Ambulatory Visit: Payer: Medicare Other

## 2024-02-17 ENCOUNTER — Encounter: Payer: Self-pay | Admitting: Family Medicine

## 2024-02-17 ENCOUNTER — Ambulatory Visit (INDEPENDENT_AMBULATORY_CARE_PROVIDER_SITE_OTHER): Payer: Medicare Other | Admitting: Family Medicine

## 2024-02-17 ENCOUNTER — Ambulatory Visit: Payer: Self-pay | Admitting: Family Medicine

## 2024-02-17 VITALS — BP 118/82 | HR 74 | Temp 97.3°F | Ht 66.0 in | Wt 210.6 lb

## 2024-02-17 DIAGNOSIS — Z0000 Encounter for general adult medical examination without abnormal findings: Secondary | ICD-10-CM | POA: Diagnosis not present

## 2024-02-17 DIAGNOSIS — R739 Hyperglycemia, unspecified: Secondary | ICD-10-CM | POA: Diagnosis not present

## 2024-02-17 DIAGNOSIS — M8000XS Age-related osteoporosis with current pathological fracture, unspecified site, sequela: Secondary | ICD-10-CM | POA: Diagnosis not present

## 2024-02-17 DIAGNOSIS — E785 Hyperlipidemia, unspecified: Secondary | ICD-10-CM

## 2024-02-17 DIAGNOSIS — I10 Essential (primary) hypertension: Secondary | ICD-10-CM

## 2024-02-17 DIAGNOSIS — E559 Vitamin D deficiency, unspecified: Secondary | ICD-10-CM | POA: Insufficient documentation

## 2024-02-17 DIAGNOSIS — Z131 Encounter for screening for diabetes mellitus: Secondary | ICD-10-CM

## 2024-02-17 LAB — LIPID PANEL
Cholesterol: 249 mg/dL — ABNORMAL HIGH (ref 0–200)
HDL: 44.4 mg/dL (ref >39.00)
LDL Cholesterol: 164 mg/dL — ABNORMAL HIGH (ref 0–99)
NonHDL: 205.08
Total CHOL/HDL Ratio: 6
Triglycerides: 205 mg/dL — ABNORMAL HIGH (ref 0.0–149.0)
VLDL: 41 mg/dL — ABNORMAL HIGH (ref 0.0–40.0)

## 2024-02-17 LAB — COMPREHENSIVE METABOLIC PANEL WITH GFR
ALT: 52 U/L — ABNORMAL HIGH (ref 0–35)
AST: 64 U/L — ABNORMAL HIGH (ref 0–37)
Albumin: 4.8 g/dL (ref 3.5–5.2)
Alkaline Phosphatase: 57 U/L (ref 39–117)
BUN: 12 mg/dL (ref 6–23)
CO2: 29 meq/L (ref 19–32)
Calcium: 9.9 mg/dL (ref 8.4–10.5)
Chloride: 99 meq/L (ref 96–112)
Creatinine, Ser: 0.76 mg/dL (ref 0.40–1.20)
GFR: 75.26 mL/min (ref >60.00)
Glucose, Bld: 92 mg/dL (ref 70–99)
Potassium: 4.1 meq/L (ref 3.5–5.1)
Sodium: 139 meq/L (ref 135–145)
Total Bilirubin: 0.8 mg/dL (ref 0.2–1.2)
Total Protein: 8.1 g/dL (ref 6.0–8.3)

## 2024-02-17 LAB — CBC WITH DIFFERENTIAL/PLATELET
Basophils Absolute: 0 K/uL (ref 0.0–0.1)
Basophils Relative: 0.5 % (ref 0.0–3.0)
Eosinophils Absolute: 0.1 K/uL (ref 0.0–0.7)
Eosinophils Relative: 1.4 % (ref 0.0–5.0)
HCT: 40.8 % (ref 36.0–46.0)
Hemoglobin: 13.8 g/dL (ref 12.0–15.0)
Lymphocytes Relative: 24.2 % (ref 12.0–46.0)
Lymphs Abs: 1.2 K/uL (ref 0.7–4.0)
MCHC: 33.8 g/dL (ref 30.0–36.0)
MCV: 90.9 fl (ref 78.0–100.0)
Monocytes Absolute: 0.5 K/uL (ref 0.1–1.0)
Monocytes Relative: 10.1 % (ref 3.0–12.0)
Neutro Abs: 3.3 K/uL (ref 1.4–7.7)
Neutrophils Relative %: 63.8 % (ref 43.0–77.0)
Platelets: 245 K/uL (ref 150.0–400.0)
RBC: 4.49 Mil/uL (ref 3.87–5.11)
RDW: 13.7 % (ref 11.5–15.5)
WBC: 5.2 K/uL (ref 4.0–10.5)

## 2024-02-17 LAB — HEMOGLOBIN A1C: Hgb A1c MFr Bld: 6.4 % (ref 4.6–6.5)

## 2024-02-17 LAB — VITAMIN D 25 HYDROXY (VIT D DEFICIENCY, FRACTURES): VITD: 42.48 ng/mL (ref 30.00–100.00)

## 2024-02-17 MED ORDER — OLMESARTAN MEDOXOMIL 20 MG PO TABS: 20.0000 mg | ORAL_TABLET | Freq: Every day | ORAL | 3 refills | Status: AC

## 2024-02-17 MED ORDER — HYDROCHLOROTHIAZIDE 25 MG PO TABS: 12.5000 mg | ORAL_TABLET | Freq: Every day | ORAL | 0 refills | Status: DC | PRN

## 2024-02-17 NOTE — Patient Instructions (Addendum)
 Please stop by lab before you go If you have mychart- we will send your results within 3 business days of us  receiving them.  If you do not have mychart- we will call you about results within 5 business days of us  receiving them.  *please also note that you will see labs on mychart as soon as they post. I will later go in and write notes on them- will say notes from Dr. Katrinka   Schedule your bone density test at check out desk.  - located [address] across [address] from [place] - in the basement - you DO NEED an appointment for the bone density tests.   Recommended follow up: Return in about 1 year (around 02/16/2025) for physical or sooner if needed.Schedule b4 you leave.

## 2024-02-17 NOTE — Progress Notes (Signed)
 Phone [phone]   Subjective:  Patient presents today for their annual physical. Chief complaint-noted.   See problem oriented charting- ROS- full  review of systems was completed and negative except for topics noted under acute/chronic concerns  The following were reviewed and entered/updated in epic: Past Medical History:  Diagnosis Date   Allergy    mild    Anemia    past hx - 20's early 30's   Cataract    removed bilat   Hyperlipidemia    Hypertension    Sleep apnea    uses cpap   Thyroid  disease    in her 20's- short period    TRANSAMINASES, SERUM, ELEVATED 10/19/2008   Mild elevations - resolved     Patient Active Problem List   Diagnosis Date Noted   Osteoporosis 03/04/2017    Priority: High   Vitamin D  deficiency 02/17/2024    Priority: Medium    Hyperglycemia 12/01/2014    Priority: Medium    Essential hypertension 10/19/2008    Priority: Medium    Hyperlipidemia 01/25/2007    Priority: Medium    Closed fracture of one rib of right side 06/24/2017    Priority: Low   Closed fracture of right wrist 06/24/2017    Priority: Low   Polyp of colon, adenomatous 02/06/2015    Priority: Low   OSA on CPAP 07/11/2008    Priority: Low   Past Surgical History:  Procedure Laterality Date   ABDOMINAL HYSTERECTOMY     severe bleeding, still has ovaries   cataract surgery     bilateral   COLONOSCOPY  07/24/2020   01/31/15- Norleen Kiang   EYE SURGERY     FLEXIBLE SIGMOIDOSCOPY  2001   POLYPECTOMY     SIGMOIDOSCOPY      Family History  Problem Relation Age of Onset   Stroke Mother        in 59s   Stroke Father        in 70s   Other Sister        spinal muscular atrophy- 1 sister, 2 brothers   Other Brother        spinal muscular atrophy- 1 sister, 2 brothers   Lung cancer Brother        x2 smokers   Other Brother        spinal muscular atrophy- 1 sister, 2 brothers   Scleroderma Brother        died from thrombosis   Hypertension Other    Colon  cancer Neg Hx    Colon polyps Neg Hx    Esophageal cancer Neg Hx    Rectal cancer Neg Hx    Stomach cancer Neg Hx    Breast cancer Neg Hx    BRCA 1/2 Neg Hx     Medications- reviewed and updated Current Outpatient Medications  Medication Sig Dispense Refill   Multiple Vitamin (MULTIVITAMIN) capsule Take 1 capsule by mouth daily.     olmesartan  (BENICAR ) 20 MG tablet Take 1 tablet (20 mg total) by mouth daily. 90 tablet 3   hydrochlorothiazide  (HYDRODIURIL ) 25 MG tablet Take 0.5 tablets (12.5 mg total) by mouth daily as needed (if blood pressure >140/90). 45 tablet 0   No current facility-administered medications for this visit.    Allergies-reviewed and updated Allergies  Allergen Reactions   Tetanus-Diphtheria Toxoids Td     Severe rash, fever, hospitalized.    Codeine Sulfate     REACTION: unspecified    Social History  Social History Narrative   Married (49 years in [facility] patient of Dr. Katrinka). 3 children. 5 grandkids.       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 Oropharynx normal Neck: no thyromegaly CV: RRR no murmurs rubs or gallops Lungs: CTAB no crackles, wheeze, rhonchi Abdomen: soft/nontender/nondistended/normal bowel sounds. No rebound or guarding.  Ext: minimal edema Skin: warm, dry Neuro: grossly normal, moves all extremities, PERRLA   Assessment and Plan   78 y.o. female presenting for annual physical.  Health Maintenance counseling: 1. Anticipatory guidance: Patient counseled regarding regular dental exams -q6 months, eye exams - yearly,  avoiding smoking and second  hand smoke , limiting alcohol to 1 beverage per day- doesn't drink , no illicit drugs .   2. Risk factor reduction:  Advised patient of need for regular exercise and diet rich and fruits and vegetables to reduce risk of heart attack and stroke.  Exercise- walking 4-5 days a week- 30 minutes miniumum.  Diet/weight management-Down 15 pounds from last physical!  But up slightly from a year ago when got as low as 203-still down from March 2024 of 225. .  Wt Readings from Last 3 Encounters:  02/17/24 210 lb 9.6 oz (95.5 kg)  11/19/23 212 lb 6.4 oz (96.3 kg)  02/24/23 203 lb 6.4 oz (92.3 kg)  3. Immunizations/screenings/ancillary studies- holding on flu shot- does not usually get. Discontinued COVID. Tetanus, Diphtheria, and Pertussis (Tdap) allergy.  Immunization History  Administered Date(s) Administered   Influenza-Unspecified 03/16/2014   Moderna Sars-Covid-2 Vaccination 07/19/2019, 08/17/2019   Pneumococcal Conjugate-13 12/01/2014   Pneumococcal Polysaccharide-23 12/17/2011   Zoster Recombinant(Shingrix ) 12/13/2018, 02/10/2019   Zoster, Live 12/17/2011   4. Cervical cancer screening- past age based screening recommendations. No hx abnormal pap- not seeing gynecology. No blood or discharge 5. Breast cancer screening-  breast exam - opts out- and mammogram 01/05/24 6. Colon cancer screening - History of adenomatous polyp of the colon 07/24/2020-no repeat due to age per GI Dr. Abran . No blood in stool or melena 7. Skin cancer screening- sees dermatology. advised regular sunscreen use. Denies worrisome, changing, or new skin lesions.  8. Birth control/STD check- only active with husband/postmenopausal 9. Osteoporosis screening at 9- prior fragility fracture so technically has osteoporosis given osteopenia- she has increased her D consumption- will check vitamin D  today. Last DEXA 2020- order repeat. Still working on weight bearing exercise- 4-5 x a week 10. Smoking associated screening - never  smoker    Status of chronic or acute concerns   #prior fall- no more lately. Got rid of rug and patio stone  #slight tremor- sometimes hand with art sometimes in head with watching tv. Family has noted. Has several family members with this. Suspect essential tremor.  #hypertension S: medication: Olmesartan  20 mg- if pressure runs low has old 5 mg tablets uses instead, hydrochlorothiazide  12.5 mg if blood pressure over 140/90- maybe monthly BP Readings from Last 3 Encounters:  02/17/24 118/82  11/19/23 120/80  02/24/23 120/80  A/P: well controlled continue current medications   #  hyperlipidemia S: Medication: none. The 10-year ASCVD risk score (Arnett DK, et al., 2019) is: 24.2% Lab Results  Component Value Date   CHOL 228 (H) 02/24/2023   HDL 49.60 02/24/2023   LDLCALC 145 (H) 02/24/2023   LDLDIRECT 145.0 03/25/2021   TRIG 168.0 (H) 02/24/2023   CHOLHDL 5 02/24/2023   A/P: wants to hold off on medications and keep working on healthy eating and regular exercise - recheck levels today though. Wants to reconsider CT calcium next year   # Hyperglycemia/insulin resistance/prediabetes-peak A1c at 6.3 in 2024 S:  Medication: none Lab Results  Component Value Date   HGBA1C 6.1 02/24/2023   HGBA1C 6.3 08/21/2022   HGBA1C 6.1 03/25/2021   A/P: improved last year- hoping for more improvement- update a1c  #Vitamin D  deficiency S: Medication: 1000 units at least but was too high last year and had to come down A/P: update levels     Recommended follow up: Return in about 1 year (around 02/16/2025) for physical or sooner if needed.Schedule b4 you leave. Future Appointments  Date Time Provider Department Center  04/26/2024 11:20 AM [facility] ANNUAL WELLNESS VISIT 1 [facility] [place]   Lab/Order associations: fasting   ICD-10-CM   1. Preventative health care  Z00.00     2. Hyperlipidemia, unspecified hyperlipidemia type  E78.5 Comprehensive metabolic panel with GFR    CBC with  Differential/Platelet    Lipid panel    3. Hyperglycemia  R73.9 Hemoglobin A1c    4. Screening for diabetes mellitus  Z13.1 Hemoglobin A1c    5. Osteoporosis with current pathological fracture, unspecified osteoporosis type, sequela  M80.00XS VITAMIN D  25 Hydroxy (Vit-D Deficiency, Fractures)    DG Bone Density    6. Essential hypertension  I10     7. Vitamin D  deficiency  E55.9       Meds ordered this encounter  Medications   hydrochlorothiazide  (HYDRODIURIL ) 25 MG tablet    Sig: Take 0.5 tablets (12.5 mg total) by mouth daily as needed (if blood pressure >140/90).    Dispense:  45 tablet    Refill:  0   olmesartan  (BENICAR ) 20 MG tablet    Sig: Take 1 tablet (20 mg total) by mouth daily.    Dispense:  90 tablet    Refill:  3    Return precautions advised.  Garnette Lukes, MD

## 2024-02-26 ENCOUNTER — Inpatient Hospital Stay: Admission: RE | Admit: 2024-02-26 | Source: Ambulatory Visit

## 2024-04-26 ENCOUNTER — Ambulatory Visit

## 2024-04-29 ENCOUNTER — Other Ambulatory Visit: Payer: Self-pay | Admitting: Family Medicine

## 2024-05-03 ENCOUNTER — Ambulatory Visit (INDEPENDENT_AMBULATORY_CARE_PROVIDER_SITE_OTHER)

## 2024-05-03 VITALS — BP 122/78 | HR 61 | Temp 97.3°F | Ht 66.0 in | Wt 219.4 lb

## 2024-05-03 DIAGNOSIS — Z0000 Encounter for general adult medical examination without abnormal findings: Secondary | ICD-10-CM | POA: Diagnosis not present

## 2024-05-03 NOTE — Progress Notes (Signed)
 Chief Complaint  Patient presents with   Medicare Wellness     Subjective:   Erica Burke is a 78 y.o. female who presents for a Medicare Annual Wellness Visit.  Allergies (verified) Tetanus-diphtheria toxoids td and Codeine sulfate   History: Past Medical History:  Diagnosis Date   Allergy    mild    Anemia    past hx - 20's early 30's   Cataract    removed bilat   Hyperlipidemia    Hypertension    Sleep apnea    uses cpap   Thyroid  disease    in her 20's- short period    TRANSAMINASES, SERUM, ELEVATED 10/19/2008   Mild elevations - resolved     Past Surgical History:  Procedure Laterality Date   ABDOMINAL HYSTERECTOMY     severe bleeding, still has ovaries   cataract surgery     bilateral   COLONOSCOPY  07/24/2020   01/31/15- Norleen Kiang   EYE SURGERY     FLEXIBLE SIGMOIDOSCOPY  2001   POLYPECTOMY     SIGMOIDOSCOPY     Family History  Problem Relation Age of Onset   Stroke Mother        in 34s   Stroke Father        in 70s   Other Sister        spinal muscular atrophy- 1 sister, 2 brothers   Other Brother        spinal muscular atrophy- 1 sister, 2 brothers   Lung cancer Brother        x2 smokers   Other Brother        spinal muscular atrophy- 1 sister, 2 brothers   Scleroderma Brother        died from thrombosis   Hypertension Other    Colon cancer Neg Hx    Colon polyps Neg Hx    Esophageal cancer Neg Hx    Rectal cancer Neg Hx    Stomach cancer Neg Hx    Breast cancer Neg Hx    BRCA 1/2 Neg Hx    Social History   Occupational History   Not on file  Tobacco Use   Smoking status: Never   Smokeless tobacco: Never  Substance and Sexual Activity   Alcohol use: No    Alcohol/week: 0.0 standard drinks of alcohol   Drug use: No   Sexual activity: Not on file   Tobacco Counseling Counseling given: Not Answered  SDOH Screenings   Food Insecurity: No Food Insecurity (05/03/2024)  Housing: Unknown (05/03/2024)  Transportation Needs: No  Transportation Needs (05/03/2024)  Utilities: Not At Risk (05/03/2024)  Depression (PHQ2-9): Low Risk  (05/03/2024)  Financial Resource Strain: Low Risk  (02/16/2024)  Physical Activity: Sufficiently Active (05/03/2024)  Social Connections: Socially Integrated (05/03/2024)  Stress: No Stress Concern Present (05/03/2024)  Tobacco Use: Low Risk  (05/03/2024)  Health Literacy: Adequate Health Literacy (05/03/2024)   See flowsheets for full screening details  Depression Screen PHQ 2 & 9 Depression Scale- Over the past 2 weeks, how often have you been bothered by any of the following problems? Little interest or pleasure in doing things: 0 Feeling down, depressed, or hopeless (PHQ Adolescent also includes...irritable): 0 PHQ-2 Total Score: 0 Trouble falling or staying asleep, or sleeping too much: 0 Feeling tired or having little energy: 0 Poor appetite or overeating (PHQ Adolescent also includes...weight loss): 0 Feeling bad about yourself - or that you are a failure or have let yourself or your  family down: 0 Trouble concentrating on things, such as reading the newspaper or watching television [facility] Adolescent also includes...like school work): 0 Moving or speaking so slowly that other people could have noticed. Or the opposite - being so fidgety or restless that you have been moving around a lot more than usual: 0 Thoughts that you would be better off dead, or of hurting yourself in some way: 0 PHQ-9 Total Score: 0 If you checked off any problems, how difficult have these problems made it for you to do your work, take care of things at home, or get along with other people?: Not difficult at all     Goals Addressed               This Visit's Progress     weight loss (pt-stated)        Get back on track with weight loss        Visit info / Clinical Intake: Medicare Wellness Visit Type:: Subsequent Annual Wellness Visit Persons participating in visit:: patient Medicare Wellness  Visit Mode:: In-person (required for WTM) Information given by:: patient Interpreter Needed?: No Pre-visit prep was completed: yes AWV questionnaire completed by patient prior to visit?: no Living arrangements:: lives with spouse/significant other Patient's Overall Health Status Rating: very good Typical amount of pain: none Does pain affect daily life?: no Are you currently prescribed opioids?: no  Dietary Habits and Nutritional Risks How many meals a day?: 2 Eats fruit and vegetables daily?: yes Most meals are obtained by: preparing own meals; eating out In the last 2 weeks, have you had any of the following?: none Diabetic:: no  Functional Status Activities of Daily Living (to include ambulation/medication): Independent Ambulation: Independent with device- listed below Home Assistive Devices/Equipment: CPAP; Eyeglasses Medication Administration: Independent Home Management: Independent Manage your own finances?: yes Primary transportation is: driving Concerns about vision?: no *vision screening is required for WTM* Concerns about hearing?: (!) yes Uses hearing aids?: (!) yes (don't wear hearing aids all the time) Hear whispered voice?: yes  Fall Screening Falls in the past year?: 0 Number of falls in past year: 0 Was there an injury with Fall?: 0 Fall Risk Category Calculator: 0 Patient Fall Risk Level: Low Fall Risk  Fall Risk Patient at Risk for Falls Due to: No Fall Risks Fall risk Follow up: Falls prevention discussed  Home and Transportation Safety: All rugs have non-skid backing?: yes All stairs or steps have railings?: yes Grab bars in the bathtub or shower?: yes Have non-skid surface in bathtub or shower?: yes Good home lighting?: yes Regular seat belt use?: yes Hospital stays in the last year:: no  Cognitive Assessment Difficulty concentrating, remembering, or making decisions? : no Will 6CIT or Mini Cog be Completed: yes What year is it?: 0  points What month is it?: 0 points Give patient an address phrase to remember (5 components): [address] [place]  About what time is it?: 0 points Count backwards from 20 to 1: 0 points Say the months of the year in reverse: 0 points Repeat the address phrase from earlier: 2 points 6 CIT Score: 2 points  Advance Directives (For Healthcare) Does Patient Have a Medical Advance Directive?: Yes Type of Advance Directive: Healthcare Power of Attorney Copy of Healthcare Power of Attorney in Chart?: No - copy requested  Reviewed/Updated  Reviewed/Updated: Reviewed All (Medical, Surgical, Family, Medications, Allergies, Care Teams, Patient Goals)        Objective:    Today's Vitals  05/03/24 1212  BP: 122/78  Pulse: 61  Temp: (!) 97.3 F (36.3 C)  SpO2: 96%  Weight: 219 lb 6.4 oz (99.5 kg)  Height: 5' 6 (1.676 m)   Body mass index is 35.41 kg/m.  Current Medications (verified) Outpatient Encounter Medications as of 05/03/2024  Medication Sig   hydrochlorothiazide  (HYDRODIURIL ) 25 MG tablet Take 0.5 tablets (12.5 mg total) by mouth daily as needed (if blood pressure >140/90).   Multiple Vitamin (MULTIVITAMIN) capsule Take 1 capsule by mouth daily.   olmesartan  (BENICAR ) 20 MG tablet Take 1 tablet (20 mg total) by mouth daily.   olmesartan  (BENICAR ) 5 MG tablet TAKE 1 TABLET (5 MG TOTAL) BY MOUTH DAILY.   No facility-administered encounter medications on file as of 05/03/2024.   Hearing/Vision screen Hearing Screening - Comments:: Pt has hearing aids  Vision Screening - Comments:: Wears rx glasses - up to date with routine eye exams with Dr Marcey  Immunizations and Health Maintenance Health Maintenance  Topic Date Due   Influenza Vaccine  09/13/2024 (Originally 01/15/2024)   Medicare Annual Wellness (AWV)  05/03/2025   Pneumococcal Vaccine: 50+ Years  Completed   DEXA SCAN  Completed   Hepatitis C Screening  Completed   Zoster Vaccines- Shingrix   Completed    Meningococcal B Vaccine  Aged Out   DTaP/Tdap/Td  Discontinued   Mammogram  Discontinued   Colonoscopy  Discontinued   COVID-19 Vaccine  Discontinued        Assessment/Plan:  This is a routine wellness examination for [place].  Patient Care Team: Katrinka Garnette KIDD, MD as PCP - General (Family Medicine)  I have personally reviewed and noted the following in the patient's chart:   Medical and social history Use of alcohol, tobacco or illicit drugs  Current medications and supplements including opioid prescriptions. Functional ability and status Nutritional status Physical activity Advanced directives List of other physicians Hospitalizations, surgeries, and ER visits in previous 12 months Vitals Screenings to include cognitive, depression, and falls Referrals and appointments  No orders of the defined types were placed in this encounter.  In addition, I have reviewed and discussed with patient certain preventive protocols, quality metrics, and best practice recommendations. A written personalized care plan for preventive services as well as general preventive health recommendations were provided to patient.   Ellouise VEAR Haws, LPN   [id number]   Return in 1 year for AWV   After Visit Summary: (In Person-Printed) AVS printed and given to the patient  Nurse Notes: Nothing significant at this time

## 2024-05-03 NOTE — Patient Instructions (Signed)
 Erica Burke,  Thank you for taking the time for your Medicare Wellness Visit. I appreciate your continued commitment to your health goals. Please review the care plan we discussed, and feel free to reach out if I can assist you further.  Please note that Annual Wellness Visits do not include a physical exam. Some assessments may be limited, especially if the visit was conducted virtually. If needed, we may recommend an in-person follow-up with your provider.  Ongoing Care Seeing your primary care provider every 3 to 6 months helps us  monitor your health and provide consistent, personalized care.   Referrals If a referral was made during today's visit and you haven't received any updates within two weeks, please contact the referred provider directly to check on the status.  Recommended Screenings:  Health Maintenance  Topic Date Due   Medicare Annual Wellness Visit  01/13/2024   Flu Shot  09/13/2024*   Pneumococcal Vaccine for age over 56  Completed   DEXA scan (bone density measurement)  Completed   Hepatitis C Screening  Completed   Zoster (Shingles) Vaccine  Completed   Meningitis B Vaccine  Aged Out   DTaP/Tdap/Td vaccine  Discontinued   Breast Cancer Screening  Discontinued   Colon Cancer Screening  Discontinued   COVID-19 Vaccine  Discontinued  *Topic was postponed. The date shown is not the original due date.       01/30/2023   11:46 AM  Advanced Directives  Does Patient Have a Medical Advance Directive? No  Would patient like information on creating a medical advance directive? No - Patient declined    Vision: Annual vision screenings are recommended for early detection of glaucoma, cataracts, and diabetic retinopathy. These exams can also reveal signs of chronic conditions such as diabetes and high blood pressure.  Dental: Annual dental screenings help detect early signs of oral cancer, gum disease, and other conditions linked to overall health, including heart disease  and diabetes.  Please see the attached documents for additional preventive care recommendations.

## 2024-05-09 ENCOUNTER — Other Ambulatory Visit: Payer: Self-pay | Admitting: Family Medicine

## 2024-05-11 ENCOUNTER — Other Ambulatory Visit

## 2024-05-26 ENCOUNTER — Ambulatory Visit (INDEPENDENT_AMBULATORY_CARE_PROVIDER_SITE_OTHER)
Admission: RE | Admit: 2024-05-26 | Discharge: 2024-05-26 | Disposition: A | Discharge status: Home / Self Care | Source: Ambulatory Visit | Attending: Family Medicine | Admitting: Family Medicine

## 2024-05-26 DIAGNOSIS — M8589 Other specified disorders of bone density and structure, multiple sites: Secondary | ICD-10-CM

## 2024-05-26 DIAGNOSIS — M8000XS Age-related osteoporosis with current pathological fracture, unspecified site, sequela: Secondary | ICD-10-CM

## 2025-02-21 ENCOUNTER — Encounter: Admitting: Family Medicine

## 2025-05-15 ENCOUNTER — Ambulatory Visit
